# Patient Record
Sex: Female | Born: 1990 | Race: Black or African American | Hispanic: No | Marital: Single | Smoking: Never smoker
Health system: Southern US, Community
[De-identification: ages and names within clinical notes are randomized; demographics above are authoritative.]

## PROBLEM LIST (undated history)

## (undated) DIAGNOSIS — Z789 Other specified health status: Secondary | ICD-10-CM

## (undated) HISTORY — PX: WRIST SURGERY: SHX841

---
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
## 2011-01-09 ENCOUNTER — Inpatient Hospital Stay (HOSPITAL_COMMUNITY)
Admission: AD | Admit: 2011-01-09 | Discharge: 2011-01-10 | Disposition: A | Discharge status: Home / Self Care | Payer: Medicaid Other | Source: Ambulatory Visit | Attending: Obstetrics | Admitting: Obstetrics

## 2011-01-09 DIAGNOSIS — E860 Dehydration: Secondary | ICD-10-CM

## 2011-01-09 DIAGNOSIS — Z331 Pregnant state, incidental: Secondary | ICD-10-CM

## 2011-01-09 DIAGNOSIS — D649 Anemia, unspecified: Secondary | ICD-10-CM | POA: Insufficient documentation

## 2011-01-09 DIAGNOSIS — R42 Dizziness and giddiness: Secondary | ICD-10-CM

## 2011-01-09 DIAGNOSIS — O99019 Anemia complicating pregnancy, unspecified trimester: Secondary | ICD-10-CM | POA: Insufficient documentation

## 2011-01-09 HISTORY — DX: Other specified health status: Z78.9

## 2011-01-10 ENCOUNTER — Encounter (HOSPITAL_COMMUNITY): Payer: Self-pay | Admitting: *Deleted

## 2011-01-10 LAB — URINALYSIS, ROUTINE W REFLEX MICROSCOPIC
Bilirubin Urine: NEGATIVE
Glucose, UA: NEGATIVE mg/dL
Ketones, ur: 15 mg/dL — AB
Leukocytes, UA: NEGATIVE
Nitrite: NEGATIVE
Protein, ur: NEGATIVE mg/dL
Specific Gravity, Urine: >1.03 — ABNORMAL HIGH (ref 1.005–1.030)
Urobilinogen, UA: 0.2 mg/dL (ref 0.0–1.0)
pH: 6 (ref 5.0–8.0)

## 2011-01-10 LAB — COMPREHENSIVE METABOLIC PANEL
ALT: 12 U/L (ref 0–35)
AST: 14 U/L (ref 0–37)
Albumin: 3.4 g/dL — ABNORMAL LOW (ref 3.5–5.2)
Alkaline Phosphatase: 66 U/L (ref 39–117)
BUN: 9 mg/dL (ref 6–23)
CO2: 23 mEq/L (ref 19–32)
Calcium: 9.7 mg/dL (ref 8.4–10.5)
Chloride: 102 mEq/L (ref 96–112)
Creatinine, Ser: 0.62 mg/dL (ref 0.50–1.10)
GFR calc Af Amer: >90 mL/min (ref >90)
GFR calc non Af Amer: >90 mL/min (ref >90)
Glucose, Bld: 83 mg/dL (ref 70–99)
Potassium: 4.3 mEq/L (ref 3.5–5.1)
Sodium: 134 mEq/L — ABNORMAL LOW (ref 135–145)
Total Bilirubin: 0.1 mg/dL — ABNORMAL LOW (ref 0.3–1.2)
Total Protein: 6.9 g/dL (ref 6.0–8.3)

## 2011-01-10 LAB — CBC
HCT: 34.1 % — ABNORMAL LOW (ref 36.0–46.0)
Hemoglobin: 11.3 g/dL — ABNORMAL LOW (ref 12.0–15.0)
MCH: 27.5 pg (ref 26.0–34.0)
MCHC: 33.1 g/dL (ref 30.0–36.0)
MCV: 83 fL (ref 78.0–100.0)
Platelets: 212 10*3/uL (ref 150–400)
RBC: 4.11 MIL/uL (ref 3.87–5.11)
RDW: 14.8 % (ref 11.5–15.5)
WBC: 7.8 10*3/uL (ref 4.0–10.5)

## 2011-01-10 LAB — URINE MICROSCOPIC-ADD ON

## 2011-01-10 NOTE — ED Provider Notes (Signed)
History     CSN: [id number] Arrival date & time: 01/09/2011 11:41 PM   None     Chief Complaint  Patient presents with  . Dizziness   HPI Kathy Hayes is a 20 y.o. female @ [redacted]w[redacted]d gestation who presents to MAU after feeling dizzy at work. 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 She had eaten just prior to going to work. She sat down for a while be still didn't feel well and decided to go home. She was doing the same job she has done for years. She denies vaginal bleeding or abdominal pain.  She is feeling back to normal now.  Past Medical History  Diagnosis Date  . No pertinent past medical history     Past Surgical History  Procedure Date  . Wrist surgery     No family history on file.  History  Substance Use Topics  . Smoking status: Never Smoker   . Smokeless tobacco: Not on file  . Alcohol Use: No    OB History    Grav Para Term Preterm Abortions TAB SAB Ect Mult Living   1               Review of Systems  Constitutional: Negative for fever, chills, diaphoresis and fatigue.  HENT: Negative for ear pain, congestion, sore throat, facial swelling, neck pain, neck stiffness, dental problem and sinus pressure.   Eyes: Negative for photophobia, pain and discharge.  Respiratory: Negative for cough, chest tightness and wheezing.   Gastrointestinal: Negative for nausea, vomiting, abdominal pain, diarrhea, constipation and abdominal distention.  Genitourinary: Negative for dysuria, frequency, flank pain, vaginal bleeding, vaginal discharge, difficulty urinating and pelvic pain.       Pregnant.  Musculoskeletal: Negative for myalgias, back pain and gait problem.  Skin: Negative for color change and rash.  Neurological: Positive for dizziness and light-headedness. Negative for speech difficulty, weakness, numbness and headaches.  Psychiatric/Behavioral:  Negative for confusion and agitation.    Allergies  Review of patient's allergies indicates no known allergies.  Home Medications  No current outpatient prescriptions on file.  BP 110/61  Pulse 74  Temp 98.2 F (36.8 C)  Resp 20  Ht 5\' 5"  (1.651 m)  Wt 250 lb (113.399 kg)  BMI 41.60 kg/m2  SpO2 99%  Physical Exam  Nursing note and vitals reviewed. Constitutional: She is oriented to person, place, and time. No distress.       Obese   HENT:  Head: Normocephalic.  Eyes: EOM are normal.  Neck: Neck supple.  Cardiovascular: Normal rate.   Pulmonary/Chest: Effort normal.  Abdominal: Soft. There is no tenderness.       Positive FHT at 170 bpm. Low midline abdomen.   Musculoskeletal: Normal range of motion.  Neurological: She is alert and oriented to person, place, and time. She has normal strength. No cranial nerve deficit. She displays a negative Romberg sign. Coordination and gait normal.  Skin: Skin is warm and dry.  Psychiatric: She has a normal mood and affect. Her behavior is normal. Judgment and thought content normal.   Informal bedside ultrasound, active IUP with cardiac activity identified.   Results for orders placed during the hospital encounter of 01/09/11 (from the past 24 hour(s))  URINALYSIS, ROUTINE W REFLEX MICROSCOPIC     Status: Abnormal   Collection Time  01/10/11 12:10 AM      Component Value Range   Color, Urine YELLOW  YELLOW    Appearance CLEAR  CLEAR    Specific Gravity, Urine >1.030 (*) 1.005 - 1.030    pH 6.0  5.0 - 8.0    Glucose, UA NEGATIVE  NEGATIVE (mg/dL)   Hgb urine dipstick SMALL (*) NEGATIVE    Bilirubin Urine NEGATIVE  NEGATIVE    Ketones, ur 15 (*) NEGATIVE (mg/dL)   Protein, ur NEGATIVE  NEGATIVE (mg/dL)   Urobilinogen, UA 0.2  0.0 - 1.0 (mg/dL)   Nitrite NEGATIVE  NEGATIVE    Leukocytes, UA NEGATIVE  NEGATIVE   URINE MICROSCOPIC-ADD ON     Status: Abnormal   Collection Time   01/10/11 12:10 AM      Component Value Range    Squamous Epithelial / LPF FEW (*) RARE    WBC, UA 0-2  <3 (WBC/hpf)   RBC / HPF 3-6  <3 (RBC/hpf)   Bacteria, UA FEW (*) RARE   CBC     Status: Abnormal   Collection Time   01/10/11 12:55 AM      Component Value Range   WBC 7.8  4.0 - 10.5 (K/uL)   RBC 4.11  3.87 - 5.11 (MIL/uL)   Hemoglobin 11.3 (*) 12.0 - 15.0 (g/dL)   HCT 16.1 (*) 09.6 - 46.0 (%)   MCV 83.0  78.0 - 100.0 (fL)   MCH 27.5  26.0 - 34.0 (pg)   MCHC 33.1  30.0 - 36.0 (g/dL)   RDW 04.5  40.9 - 81.1 (%)   Platelets 212  150 - 400 (K/uL)  COMPREHENSIVE METABOLIC PANEL     Status: Abnormal   Collection Time   01/10/11 12:55 AM      Component Value Range   Sodium 134 (*) 135 - 145 (mEq/L)   Potassium 4.3  3.5 - 5.1 (mEq/L)   Chloride 102  96 - 112 (mEq/L)   CO2 23  19 - 32 (mEq/L)   Glucose, Bld 83  70 - 99 (mg/dL)   BUN 9  6 - 23 (mg/dL)   Creatinine, Ser 9.14  0.50 - 1.10 (mg/dL)   Calcium 9.7  8.4 - 78.2 (mg/dL)   Total Protein 6.9  6.0 - 8.3 (g/dL)   Albumin 3.4 (*) 3.5 - 5.2 (g/dL)   AST 14  0 - 37 (U/L)   ALT 12  0 - 35 (U/L)   Alkaline Phosphatase 66  39 - 117 (U/L)   Total Bilirubin 0.1 (*) 0.3 - 1.2 (mg/dL)   GFR calc non Af Amer >90  >90 (mL/min)   GFR calc Af Amer >90  >90 (mL/min)    Assessment: Dizziness   First trimester pregnancy   Mild dehydration   Mild anemia  Plan:  Increase po fluid intake   Eat small meals more frequently   Follow up with Dr. Gaynell Face   Urine culture, GC, Chlamydia cultures pending   ED Course  Procedures  MDM          Kerrie Buffalo, NP 01/10/11 1434

## 2011-01-10 NOTE — Progress Notes (Signed)
Pt signed the D/C form because the E-Chart signature pad was not working

## 2011-01-10 NOTE — Progress Notes (Signed)
Pt reports while at work today she started having "dizzy spells, and hot flashes" states she felt like she was going to pass out. Denies pain. Denies bleeding. Has had vomiting throughout preg. [facility] 08/14/2011

## 2011-01-10 NOTE — Progress Notes (Signed)
Pt states she feels fine-she stood t the bedside for 4 min w/o feeling dizzy or weak-states she felt that way at work at [employer] due to the light over the hot food

## 2011-02-28 NOTE — L&D Delivery Note (Signed)
Delivery Note At 10:29 AM a viable female was delivered via Vaginal, Spontaneous Delivery (Presentation: ; Occiput Anterior).  APGAR: 8, 9; weight .   Placenta status: , .  Cord: 3 vessels with the following complications: None.  Cord pH: not done  Anesthesia: None  Episiotomy: Median Lacerations: None Suture Repair: 2.0 vicryl Est. Blood Loss (mL):   Mom to postpartum.  Baby to nursery-stable.  Iesha Summerhill A 08/01/2011, 10:46 AM

## 2011-03-23 ENCOUNTER — Other Ambulatory Visit (HOSPITAL_COMMUNITY): Payer: Self-pay | Admitting: Obstetrics

## 2011-03-23 DIAGNOSIS — Z3689 Encounter for other specified antenatal screening: Secondary | ICD-10-CM

## 2011-03-28 ENCOUNTER — Ambulatory Visit (HOSPITAL_COMMUNITY)
Admission: RE | Admit: 2011-03-28 | Discharge: 2011-03-28 | Disposition: A | Discharge status: Home / Self Care | Payer: PRIVATE HEALTH INSURANCE | Source: Ambulatory Visit | Attending: Obstetrics | Admitting: Obstetrics

## 2011-03-28 DIAGNOSIS — Z1389 Encounter for screening for other disorder: Secondary | ICD-10-CM | POA: Insufficient documentation

## 2011-03-28 DIAGNOSIS — Z363 Encounter for antenatal screening for malformations: Secondary | ICD-10-CM | POA: Insufficient documentation

## 2011-03-28 DIAGNOSIS — Z3689 Encounter for other specified antenatal screening: Secondary | ICD-10-CM

## 2011-03-28 DIAGNOSIS — O358XX0 Maternal care for other (suspected) fetal abnormality and damage, not applicable or unspecified: Secondary | ICD-10-CM | POA: Insufficient documentation

## 2011-08-01 ENCOUNTER — Inpatient Hospital Stay (HOSPITAL_COMMUNITY)
Admission: AD | Admit: 2011-08-01 | Discharge: 2011-08-03 | DRG: 775 | Disposition: A | Discharge status: Home / Self Care | Payer: PRIVATE HEALTH INSURANCE | Source: Ambulatory Visit | Attending: Obstetrics | Admitting: Obstetrics

## 2011-08-01 ENCOUNTER — Encounter (HOSPITAL_COMMUNITY): Payer: Self-pay | Admitting: *Deleted

## 2011-08-01 LAB — CBC
HCT: 39.3 % (ref 36.0–46.0)
MCH: 26.1 pg (ref 26.0–34.0)
MCHC: 31.3 g/dL (ref 30.0–36.0)
MCV: 83.4 fL (ref 78.0–100.0)
RDW: 15.4 % (ref 11.5–15.5)

## 2011-08-01 LAB — OB RESULTS CONSOLE HEPATITIS B SURFACE ANTIGEN: Hepatitis B Surface Ag: NEGATIVE

## 2011-08-01 LAB — OB RESULTS CONSOLE ABO/RH: RH Type: POSITIVE

## 2011-08-01 LAB — OB RESULTS CONSOLE ANTIBODY SCREEN: Antibody Screen: NEGATIVE

## 2011-08-01 LAB — OB RESULTS CONSOLE GBS: GBS: NEGATIVE

## 2011-08-01 LAB — OB RESULTS CONSOLE RUBELLA ANTIBODY, IGM: Rubella: IMMUNE

## 2011-08-01 MED ORDER — DIPHENHYDRAMINE HCL 25 MG PO CAPS: 25.0000 mg | ORAL_CAPSULE | Freq: Four times a day (QID) | ORAL | Status: DC | PRN

## 2011-08-01 MED ORDER — LANOLIN HYDROUS EX OINT: TOPICAL_OINTMENT | CUTANEOUS | Status: DC | PRN

## 2011-08-01 MED ORDER — BUTORPHANOL TARTRATE 2 MG/ML IJ SOLN: 1.0000 mg | INTRAMUSCULAR | Status: DC | PRN

## 2011-08-01 MED ORDER — PHENYLEPHRINE 40 MCG/ML (10ML) SYRINGE FOR IV PUSH (FOR BLOOD PRESSURE SUPPORT): 80.0000 ug | PREFILLED_SYRINGE | INTRAVENOUS | Status: DC | PRN

## 2011-08-01 MED ORDER — WITCH HAZEL-GLYCERIN EX PADS: 1.0000 "application " | MEDICATED_PAD | CUTANEOUS | Status: DC | PRN

## 2011-08-01 MED ORDER — OXYTOCIN 20 UNITS IN LACTATED RINGERS INFUSION - SIMPLE
125.0000 mL/h | Freq: Once | INTRAVENOUS | Status: AC
  Administered 2011-08-01: 999 mL/h via INTRAVENOUS

## 2011-08-01 MED ORDER — ONDANSETRON HCL 4 MG/2ML IJ SOLN: 4.0000 mg | Freq: Four times a day (QID) | INTRAMUSCULAR | Status: DC | PRN

## 2011-08-01 MED ORDER — IBUPROFEN 600 MG PO TABS
600.0000 mg | ORAL_TABLET | Freq: Four times a day (QID) | ORAL | Status: DC
  Administered 2011-08-01 – 2011-08-03 (×6): 600 mg via ORAL
  Filled 2011-08-01 (×3): qty 1

## 2011-08-01 MED ORDER — EPHEDRINE 5 MG/ML INJ: 10.0000 mg | INTRAVENOUS | Status: DC | PRN

## 2011-08-01 MED ORDER — OXYCODONE-ACETAMINOPHEN 5-325 MG PO TABS: 1.0000 | ORAL_TABLET | ORAL | Status: DC | PRN

## 2011-08-01 MED ORDER — DIBUCAINE 1 % RE OINT: 1.0000 "application " | TOPICAL_OINTMENT | RECTAL | Status: DC | PRN

## 2011-08-01 MED ORDER — IBUPROFEN 600 MG PO TABS
600.0000 mg | ORAL_TABLET | Freq: Four times a day (QID) | ORAL | Status: DC | PRN
  Administered 2011-08-01: 600 mg via ORAL
  Filled 2011-08-01 (×4): qty 1

## 2011-08-01 MED ORDER — LACTATED RINGERS IV SOLN: 500.0000 mL | INTRAVENOUS | Status: DC | PRN

## 2011-08-01 MED ORDER — ONDANSETRON HCL 4 MG PO TABS: 4.0000 mg | ORAL_TABLET | ORAL | Status: DC | PRN

## 2011-08-01 MED ORDER — LACTATED RINGERS IV SOLN: 500.0000 mL | Freq: Once | INTRAVENOUS | Status: DC

## 2011-08-01 MED ORDER — DIPHENHYDRAMINE HCL 50 MG/ML IJ SOLN: 12.5000 mg | INTRAMUSCULAR | Status: DC | PRN

## 2011-08-01 MED ORDER — ACETAMINOPHEN 325 MG PO TABS: 650.0000 mg | ORAL_TABLET | ORAL | Status: DC | PRN

## 2011-08-01 MED ORDER — FENTANYL 2.5 MCG/ML BUPIVACAINE 1/10 % EPIDURAL INFUSION (WH - ANES): 14.0000 mL/h | INTRAMUSCULAR | Status: DC

## 2011-08-01 MED ORDER — LIDOCAINE HCL (PF) 1 % IJ SOLN
30.0000 mL | INTRAMUSCULAR | Status: DC | PRN
  Administered 2011-08-01: 30 mL via SUBCUTANEOUS
  Filled 2011-08-01: qty 30

## 2011-08-01 MED ORDER — CITRIC ACID-SODIUM CITRATE 334-500 MG/5ML PO SOLN: 30.0000 mL | ORAL | Status: DC | PRN

## 2011-08-01 MED ORDER — SENNOSIDES-DOCUSATE SODIUM 8.6-50 MG PO TABS
2.0000 | ORAL_TABLET | Freq: Every day | ORAL | Status: DC
  Administered 2011-08-01 – 2011-08-02 (×2): 2 via ORAL

## 2011-08-01 MED ORDER — LACTATED RINGERS IV SOLN
INTRAVENOUS | Status: DC
  Administered 2011-08-01: 10:00:00 via INTRAVENOUS

## 2011-08-01 MED ORDER — FLEET ENEMA 7-19 GM/118ML RE ENEM: 1.0000 | ENEMA | RECTAL | Status: DC | PRN

## 2011-08-01 MED ORDER — ONDANSETRON HCL 4 MG/2ML IJ SOLN: 4.0000 mg | INTRAMUSCULAR | Status: DC | PRN

## 2011-08-01 MED ORDER — PRENATAL MULTIVITAMIN CH
1.0000 | ORAL_TABLET | Freq: Every day | ORAL | Status: DC
  Administered 2011-08-02 – 2011-08-03 (×2): 1 via ORAL
  Filled 2011-08-01: qty 1

## 2011-08-01 MED ORDER — FERROUS SULFATE 325 (65 FE) MG PO TABS
325.0000 mg | ORAL_TABLET | Freq: Two times a day (BID) | ORAL | Status: DC
  Administered 2011-08-02 – 2011-08-03 (×3): 325 mg via ORAL
  Filled 2011-08-01 (×3): qty 1

## 2011-08-01 MED ORDER — OXYTOCIN BOLUS FROM INFUSION
500.0000 mL | Freq: Once | INTRAVENOUS | Status: DC
  Filled 2011-08-01: qty 1000
  Filled 2011-08-01: qty 500

## 2011-08-01 MED ORDER — TETANUS-DIPHTH-ACELL PERTUSSIS 5-2.5-18.5 LF-MCG/0.5 IM SUSP
0.5000 mL | Freq: Once | INTRAMUSCULAR | Status: AC
  Administered 2011-08-02: 0.5 mL via INTRAMUSCULAR
  Filled 2011-08-01: qty 0.5

## 2011-08-01 MED ORDER — PROMETHAZINE HCL 25 MG/ML IJ SOLN: 12.5000 mg | INTRAMUSCULAR | Status: DC | PRN

## 2011-08-01 MED ORDER — ZOLPIDEM TARTRATE 5 MG PO TABS: 5.0000 mg | ORAL_TABLET | Freq: Every evening | ORAL | Status: DC | PRN

## 2011-08-01 MED ORDER — BENZOCAINE-MENTHOL 20-0.5 % EX AERO: 1.0000 "application " | INHALATION_SPRAY | CUTANEOUS | Status: DC | PRN

## 2011-08-01 MED ORDER — SIMETHICONE 80 MG PO CHEW: 80.0000 mg | CHEWABLE_TABLET | ORAL | Status: DC | PRN

## 2011-08-01 NOTE — MAU Note (Signed)
Ctx's started at 0430. Every 2-3. Denies bleeding, ? Leaking - keep peeing on myself, I don't feel like I'm going.  Pains getting stronger and closer

## 2011-08-01 NOTE — Progress Notes (Signed)
UR Chart review completed.  

## 2011-08-01 NOTE — H&P (Signed)
This is Dr. Francoise Ceo dictating the history and physical on blank blank she is a 21 year old gravida 1 at 38+ weeks and 08/14/2011 negative GBS and she was admitted in labor 7 cm and by the time she got to labor and delivery she was 100 dilated she had a normal vaginal liver of a female Apgar 9 and 9 from the ROA position over a midline episiotomy the placenta was spontaneous and episiotomy was repaired with 2-0 Vicryl suture Past medical history negative Past surgical history negative Social history noncontributory System review negative Physical exam well-developed female post delivery HEENT negative Rest negative Lungs clear to P&Asocial history negative abdomen uterus 20 weeks postpartum heart regular rhythm no murmurs no gallops Pelvic as described above Extremities negative

## 2011-08-02 LAB — CBC
HCT: 30.7 % — ABNORMAL LOW (ref 36.0–46.0)
MCHC: 31.9 g/dL (ref 30.0–36.0)
RDW: 15.3 % (ref 11.5–15.5)

## 2011-08-02 NOTE — Progress Notes (Signed)
Patient ID: Kathy Hayes, female   DOB: 1990/09/25, 21 y.o.   MRN: [id number] Postpartum day one Vital signs normal Fundus firm Lochia moderate Legs negative No complaints

## 2011-08-03 NOTE — Discharge Instructions (Signed)
Discharge instructions   You can wash your hair  Shower  Eat what you want  Drink what you want  See me in 6 weeks  Your ankles are going to swell more in the next 2 weeks than when pregnant  No sex for 6 weeks   Kathy Hayes A, MD 08/03/2011    

## 2011-08-03 NOTE — Discharge Summary (Signed)
Obstetric Discharge Summary Reason for Admission: onset of labor Prenatal Procedures: none Intrapartum Procedures: spontaneous vaginal delivery Postpartum Procedures: none Complications-Operative and Postpartum: none Hemoglobin  Date Value Range Status  08/02/2011 9.8* 12.0-15.0 (g/dL) Final     DELTA CHECK NOTED     REPEATED TO VERIFY     HCT  Date Value Range Status  08/02/2011 30.7* 36.0-46.0 (%) Final    Physical Exam:  General: alert Lochia: appropriate Uterine Fundus: firm Incision: healing well DVT Evaluation: No evidence of DVT seen on physical exam.  Discharge Diagnoses: Term Pregnancy-delivered  Discharge Information: Date: 08/03/2011 Activity: pelvic rest Diet: routine Medications: Percocet Condition: stable Instructions: refer to practice specific booklet Discharge to: home Follow-up Information    Follow up with Macala Baldonado A, MD. Call in 6 weeks.   Contact information:   [address] [place] [address] [phone]          Newborn Data: Live born female  Birth Weight: 6 lb 15.6 oz (3164 g) APGAR: 8, 9  Home with mother.  Cinque Begley A 08/03/2011, 7:03 AM

## 2013-12-29 ENCOUNTER — Encounter (HOSPITAL_COMMUNITY): Payer: Self-pay | Admitting: *Deleted

## 2014-06-20 ENCOUNTER — Emergency Department (HOSPITAL_COMMUNITY)
Admission: EM | Admit: 2014-06-20 | Discharge: 2014-06-21 | Disposition: A | Discharge status: Home / Self Care | Payer: BLUE CROSS/BLUE SHIELD | Attending: Emergency Medicine | Admitting: Emergency Medicine

## 2014-06-20 ENCOUNTER — Emergency Department (HOSPITAL_COMMUNITY): Payer: BLUE CROSS/BLUE SHIELD

## 2014-06-20 ENCOUNTER — Encounter (HOSPITAL_COMMUNITY): Payer: Self-pay | Admitting: Emergency Medicine

## 2014-06-20 DIAGNOSIS — R079 Chest pain, unspecified: Secondary | ICD-10-CM | POA: Diagnosis not present

## 2014-06-20 DIAGNOSIS — R509 Fever, unspecified: Secondary | ICD-10-CM

## 2014-06-20 DIAGNOSIS — Z3202 Encounter for pregnancy test, result negative: Secondary | ICD-10-CM | POA: Insufficient documentation

## 2014-06-20 LAB — BASIC METABOLIC PANEL
Anion gap: 6 (ref 5–15)
BUN: 9 mg/dL (ref 6–23)
CO2: 21 mmol/L (ref 19–32)
CREATININE: 0.73 mg/dL (ref 0.50–1.10)
Calcium: 8.5 mg/dL (ref 8.4–10.5)
Chloride: 107 mmol/L (ref 96–112)
GLUCOSE: 105 mg/dL — AB (ref 70–99)
Potassium: 3.1 mmol/L — ABNORMAL LOW (ref 3.5–5.1)
Sodium: 134 mmol/L — ABNORMAL LOW (ref 135–145)

## 2014-06-20 LAB — CBC
HCT: 37.2 % (ref 36.0–46.0)
Hemoglobin: 12 g/dL (ref 12.0–15.0)
MCH: 26.4 pg (ref 26.0–34.0)
MCHC: 32.3 g/dL (ref 30.0–36.0)
MCV: 81.8 fL (ref 78.0–100.0)
Platelets: 208 10*3/uL (ref 150–400)
RBC: 4.55 MIL/uL (ref 3.87–5.11)
RDW: 14.6 % (ref 11.5–15.5)
WBC: 4.7 10*3/uL (ref 4.0–10.5)

## 2014-06-20 LAB — I-STAT TROPONIN, ED: Troponin i, poc: 0 ng/mL (ref 0.00–0.08)

## 2014-06-20 MED ORDER — ACETAMINOPHEN 325 MG PO TABS
650.0000 mg | ORAL_TABLET | Freq: Four times a day (QID) | ORAL | Status: DC | PRN
  Administered 2014-06-20: 650 mg via ORAL
  Filled 2014-06-20 (×2): qty 2

## 2014-06-20 NOTE — ED Notes (Signed)
Pt reports onset of centralized chest pain this morning. Pt states she was having abdominal pain yesterday that has turned into chest pain today. Pt reports slight cough as well.

## 2014-06-21 ENCOUNTER — Emergency Department (HOSPITAL_COMMUNITY): Payer: BLUE CROSS/BLUE SHIELD

## 2014-06-21 ENCOUNTER — Encounter (HOSPITAL_COMMUNITY): Payer: Self-pay | Admitting: Emergency Medicine

## 2014-06-21 LAB — HEPATIC FUNCTION PANEL
ALT: 12 U/L (ref 0–35)
AST: 21 U/L (ref 0–37)
Albumin: 3.5 g/dL (ref 3.5–5.2)
Alkaline Phosphatase: 70 U/L (ref 39–117)
TOTAL PROTEIN: 6.9 g/dL (ref 6.0–8.3)
Total Bilirubin: 0.4 mg/dL (ref 0.3–1.2)

## 2014-06-21 LAB — URINE MICROSCOPIC-ADD ON

## 2014-06-21 LAB — POC URINE PREG, ED: Preg Test, Ur: NEGATIVE

## 2014-06-21 LAB — URINALYSIS, ROUTINE W REFLEX MICROSCOPIC
GLUCOSE, UA: NEGATIVE mg/dL
Ketones, ur: 40 mg/dL — AB
LEUKOCYTES UA: NEGATIVE
Nitrite: NEGATIVE
PH: 6 (ref 5.0–8.0)
PROTEIN: 30 mg/dL — AB
Urobilinogen, UA: 1 mg/dL (ref 0.0–1.0)

## 2014-06-21 LAB — LIPASE, BLOOD: Lipase: 17 U/L (ref 11–59)

## 2014-06-21 LAB — RAPID STREP SCREEN (MED CTR MEBANE ONLY): STREPTOCOCCUS, GROUP A SCREEN (DIRECT): NEGATIVE

## 2014-06-21 LAB — MONONUCLEOSIS SCREEN: MONO SCREEN: NEGATIVE

## 2014-06-21 LAB — D-DIMER, QUANTITATIVE (NOT AT ARMC): D DIMER QUANT: 0.99 ug{FEU}/mL — AB (ref 0.00–0.48)

## 2014-06-21 MED ORDER — POTASSIUM CHLORIDE CRYS ER 20 MEQ PO TBCR
40.0000 meq | EXTENDED_RELEASE_TABLET | Freq: Once | ORAL | Status: AC
  Administered 2014-06-21: 40 meq via ORAL
  Filled 2014-06-21: qty 2

## 2014-06-21 MED ORDER — SUCRALFATE 1 GM/10ML PO SUSP
1.0000 g | Freq: Three times a day (TID) | ORAL | Status: DC
Start: 2014-06-21 — End: 2015-06-05

## 2014-06-21 MED ORDER — SODIUM CHLORIDE 0.9 % IV BOLUS (SEPSIS): 1000.0000 mL | Freq: Once | INTRAVENOUS | Status: DC

## 2014-06-21 MED ORDER — SODIUM CHLORIDE 0.9 % IV BOLUS (SEPSIS)
1000.0000 mL | Freq: Once | INTRAVENOUS | Status: AC
  Administered 2014-06-21: 1000 mL via INTRAVENOUS

## 2014-06-21 MED ORDER — IBUPROFEN 600 MG PO TABS: 600.0000 mg | ORAL_TABLET | Freq: Four times a day (QID) | ORAL | Status: DC | PRN

## 2014-06-21 MED ORDER — IOHEXOL 350 MG/ML SOLN
100.0000 mL | Freq: Once | INTRAVENOUS | Status: AC | PRN
  Administered 2014-06-21: 100 mL via INTRAVENOUS

## 2014-06-21 MED ORDER — PANTOPRAZOLE SODIUM 20 MG PO TBEC: 20.0000 mg | DELAYED_RELEASE_TABLET | Freq: Every day | ORAL | Status: DC

## 2014-06-21 NOTE — ED Provider Notes (Signed)
1610 - Patient care assumed from [facility], PA-C at shift change. Patient presenting for c/o chest pain; noted to be febrile on arrival. This improved with antipyretics. Patient with no leukocytosis today. Liver and kidney function preserved. Urinalysis does not suggest infection. Urine pregnancy is negative. Patient also noted to have a negative Monospot and strep screen. Chest x-ray negative for pneumonia. Patient did have a slightly elevated d-dimer for which a CT angio was ordered. This shows no evidence of pulmonary embolus or pneumonia.  Patient states that she is feeling much better. She is nontoxic and nonseptic appearing as well as in no distress. Vital signs stable over ED course. She has been hydrated in the ED. Suspect viral, flu-like illness as cause of fever. Doubt ACS as cause of chest pain. EKG nonischemic and troponin negative. Will d/c with supportive treatment and instruction for PCP follow up. Return precautions discussed and provided. Patient agreeable to plan with no unaddressed concerns. Patient discharged in good condition.   Results for orders placed or performed during the hospital encounter of 06/20/14  Rapid strep screen  Result Value Ref Range   Streptococcus, Group A Screen (Direct) NEGATIVE NEGATIVE  CBC  Result Value Ref Range   WBC 4.7 4.0 - 10.5 K/uL   RBC 4.55 3.87 - 5.11 MIL/uL   Hemoglobin 12.0 12.0 - 15.0 g/dL   HCT 96.0 45.4 - 09.8 %   MCV 81.8 78.0 - 100.0 fL   MCH 26.4 26.0 - 34.0 pg   MCHC 32.3 30.0 - 36.0 g/dL   RDW 11.9 14.7 - 82.9 %   Platelets 208 150 - 400 K/uL  Basic metabolic panel  Result Value Ref Range   Sodium 134 (L) 135 - 145 mmol/L   Potassium 3.1 (L) 3.5 - 5.1 mmol/L   Chloride 107 96 - 112 mmol/L   CO2 21 19 - 32 mmol/L   Glucose, Bld 105 (H) 70 - 99 mg/dL   BUN 9 6 - 23 mg/dL   Creatinine, Ser 5.62 0.50 - 1.10 mg/dL   Calcium 8.5 8.4 - 13.0 mg/dL   GFR calc non Af Amer >90 >90 mL/min   GFR calc Af Amer >90 >90 mL/min   Anion gap 6 5 - 15  Urinalysis with microscopic  Result Value Ref Range   Color, Urine YELLOW YELLOW   APPearance TURBID (A) CLEAR   Specific Gravity, Urine >1.030 (H) 1.005 - 1.030   pH 6.0 5.0 - 8.0   Glucose, UA NEGATIVE NEGATIVE mg/dL   Hgb urine dipstick SMALL (A) NEGATIVE   Bilirubin Urine SMALL (A) NEGATIVE   Ketones, ur 40 (A) NEGATIVE mg/dL   Protein, ur 30 (A) NEGATIVE mg/dL   Urobilinogen, UA 1.0 0.0 - 1.0 mg/dL   Nitrite NEGATIVE NEGATIVE   Leukocytes, UA NEGATIVE NEGATIVE  Mononucleosis screen  Result Value Ref Range   Mono Screen NEGATIVE NEGATIVE  D-dimer, quantitative  Result Value Ref Range   D-Dimer, Quant 0.99 (H) 0.00 - 0.48 ug/mL-FEU  Hepatic function panel  Result Value Ref Range   Total Protein 6.9 6.0 - 8.3 g/dL   Albumin 3.5 3.5 - 5.2 g/dL   AST 21 0 - 37 U/L   ALT 12 0 - 35 U/L   Alkaline Phosphatase 70 39 - 117 U/L   Total Bilirubin 0.4 0.3 - 1.2 mg/dL   Bilirubin, Direct <8.6 0.0 - 0.5 mg/dL   Indirect Bilirubin NOT CALCULATED 0.3 - 0.9 mg/dL  Lipase, blood  Result Value Ref Range  Lipase 17 11 - 59 U/L  Urine microscopic-add on  Result Value Ref Range   Squamous Epithelial / LPF MANY (A) RARE   WBC, UA 0-2 <3 WBC/hpf   RBC / HPF 0-2 <3 RBC/hpf   Bacteria, UA MANY (A) RARE   Crystals CA OXALATE CRYSTALS (A) NEGATIVE   Urine-Other MUCOUS PRESENT   I-stat troponin, ED (not at [facility])  Result Value Ref Range   Troponin i, poc 0.00 0.00 - 0.08 ng/mL   Comment 3          POC Urine Pregnancy, ED (pre-menopausal females) - do not [facility] at [facility]  Result Value Ref Range   Preg Test, Ur NEGATIVE NEGATIVE   Dg Chest 2 View  06/20/2014   CLINICAL DATA:  Chest pain which began earlier today.  Slight cough.  EXAM: CHEST  2 VIEW  COMPARISON:  None.  FINDINGS: The heart size and mediastinal contours are within normal limits. Both lungs are clear. The visualized skeletal structures are unremarkable.  IMPRESSION: No active cardiopulmonary disease.    Electronically Signed   By: Davonna BellingJohn  Curnes M.D.   On: 06/20/2014 23:25   Ct Angio Chest Pe W/cm &/or Wo Cm  06/21/2014   CLINICAL DATA:  Acute onset of substernal chest aching and epigastric abdominal pain. Diarrhea and sore throat. Elevated D-dimer. Initial encounter.  EXAM: CT ANGIOGRAPHY CHEST WITH CONTRAST  TECHNIQUE: Multidetector CT imaging of the chest was performed using the standard protocol during bolus administration of intravenous contrast. Multiplanar CT image reconstructions and MIPs were obtained to evaluate the vascular anatomy.  CONTRAST:  100mL OMNIPAQUE IOHEXOL 350 MG/ML SOLN  COMPARISON:  Chest radiograph performed 06/20/2014  FINDINGS: There is no evidence of pulmonary embolus.  The lungs are clear bilaterally. There is no evidence of significant focal consolidation, pleural effusion or pneumothorax. No masses are identified; no abnormal focal contrast enhancement is seen.  The mediastinum is unremarkable in appearance. No mediastinal lymphadenopathy is seen. No pericardial effusion is identified. The great vessels are grossly unremarkable in appearance. No axillary lymphadenopathy is seen. The visualized portions of the thyroid gland are unremarkable in appearance.  The visualized portions of the liver and spleen are unremarkable.  No acute osseous abnormalities are seen.  Review of the MIP images confirms the above findings.  IMPRESSION: 1. No evidence of pulmonary embolus. 2. Lungs clear bilaterally.   Electronically Signed   By: Roanna RaiderJeffery  Chang M.D.   On: 06/21/2014 03:54      Antony MaduraKelly Channin Agustin, PA-C 06/21/14 [id number]  April Palumbo, MD 06/21/14 [id number]

## 2014-06-21 NOTE — ED Provider Notes (Signed)
CSN: [id number]     Arrival date & time 06/20/14  2152 History   First MD Initiated Contact with Patient 06/20/14 2345     Chief Complaint  Patient presents with  . Chest Pain     (Consider location/radiation/quality/duration/timing/severity/associated sxs/prior Treatment) HPI   Kathy Hayes is a 24 y.o. female complaining of laboratory upper abdominal pain onset yesterday associated with watery 2 episodes of watery diarrhea. She had nausea with no vomiting. States that when she woke up this morning she had substernal chest pain which she describes as aching and rated at 6 out of 10, no pain medication taken prior to arrival. She endorses dry cough, denies pleuritic exacerbation of pain. She has Implanon birth control, denies recent trips or mobilizations, calf pain, leg swelling. She states that the pain is exacerbated by laying supine. She did not realize she had a fever. States that she had a sore throat yesterday which is resolved. She denies rhinorrhea, sick contacts, dysuria, hematuria, rash, headache, cervicalgia, recent travel, states she's been sleeping less than normal but that's just because she's been very busy over the last few weeks with church and her children.  Past Medical History  Diagnosis Date  . No pertinent past medical history    Past Surgical History  Procedure Laterality Date  . Wrist surgery     Family History  Problem Relation Age of Onset  . Anesthesia problems Neg Hx    History  Substance Use Topics  . Smoking status: Never Smoker   . Smokeless tobacco: Never Used  . Alcohol Use: No   OB History    Gravida Para Term Preterm AB TAB SAB Ectopic Multiple Living   0 0 0 0 0 0 1     Review of Systems  10 systems reviewed and found to be negative, except as noted in the HPI.  Allergies  Review of patient's allergies indicates no known allergies.  Home Medications   Prior to Admission medications   Medication Sig Start Date End Date Taking?  Authorizing Provider  ibuprofen (ADVIL,MOTRIN) 200 MG tablet Take 400 mg by mouth every 6 (six) hours as needed for moderate pain.   Yes Historical Provider, MD   BP 147/109 mmHg  Pulse 103  Temp(Src) 102.2 F (39 C) (Oral)  Resp 20  Ht  (1.626 m)  Wt 280 lb (127.007 kg)  BMI 48.04 kg/m2  SpO2 99%  LMP 06/12/2014 Physical Exam  Constitutional: She is oriented to person, place, and time. She appears well-developed and well-nourished. No distress.  Feels warm  HENT:  Head: Normocephalic and atraumatic.  Mouth/Throat: Oropharynx is clear and moist.  No drooling or stridor. Posterior pharynx mildly erythematous no significant tonsillar hypertrophy. No exudate. Soft palate rises symmetrically. No TTP or induration under tongue.   No tenderness to palpation of frontal or bilateral maxillary sinuses.  No mucosal edema in the nares.  Bilateral tympanic membranes with normal architecture and good light reflex.    Eyes: Conjunctivae and EOM are normal. Pupils are equal, round, and reactive to light.  Neck: Normal range of motion. Neck supple.  FROM to C-spine. Pt can touch chin to chest without discomfort. No TTP of midline cervical spine.   Cardiovascular: Normal rate, regular rhythm and intact distal pulses.   Pulmonary/Chest: Effort normal and breath sounds normal. No respiratory distress. She has no wheezes. She has no rales. She exhibits no tenderness.  Abdominal: Soft. Bowel sounds are normal. She exhibits no  distension and no mass. There is no tenderness. There is no rebound and no guarding.  Musculoskeletal: Normal range of motion.  No calf asymmetry, superficial collaterals, palpable cords, edema, Homans sign negative bilaterally.    Lymphadenopathy:    She has no cervical adenopathy.  Neurological: She is alert and oriented to person, place, and time.  Skin: Skin is warm. She is not diaphoretic.  Psychiatric: She has a normal mood and affect.  Nursing note and vitals  reviewed.   ED Course  Procedures (including critical care time) Labs Review Labs Reviewed  BASIC METABOLIC PANEL - Abnormal; Notable for the following:    Sodium 134 (*)    Potassium 3.1 (*)    Glucose, Bld 105 (*)    All other components within normal limits  RAPID STREP SCREEN  CBC  URINALYSIS, ROUTINE W REFLEX MICROSCOPIC  MONONUCLEOSIS SCREEN  D-DIMER, QUANTITATIVE  HEPATIC FUNCTION PANEL  LIPASE, BLOOD  I-STAT TROPOININ, ED  POC URINE PREG, ED  I-STAT BETA HCG BLOOD, ED ([facility], [facility], [facility] ONLY)    Imaging Review Dg Chest 2 View  06/20/2014   CLINICAL DATA:  Chest pain which began earlier today.  Slight cough.  EXAM: CHEST  2 VIEW  COMPARISON:  None.  FINDINGS: The heart size and mediastinal contours are within normal limits. Both lungs are clear. The visualized skeletal structures are unremarkable.  IMPRESSION: No active cardiopulmonary disease.   Electronically Signed   By: Davonna BellingJohn  Curnes M.D.   On: 06/20/2014 23:25     EKG Interpretation   Date/Time:  Saturday June 20 2014 22:02:15 EDT Ventricular Rate:  91 PR Interval:  140 QRS Duration: 82 QT Interval:  331 QTC Calculation: 407 R Axis:   37 Text Interpretation:  Sinus rhythm Confirmed by [facility]  MD, APRIL  ([id number]) on 06/21/2014 12:20:26 AM      MDM   Final diagnoses:  None    Filed Vitals:   06/20/14 2204  BP: 147/109  Pulse: 103  Temp: 102.2 F (39 C)  TempSrc: Oral  Resp: 20  Height: 5\' 4"  (1.626 m)  Weight: 280 lb (127.007 kg)  SpO2: 99%    Medications  acetaminophen (TYLENOL) tablet 650 mg (650 mg Oral Given 06/20/14 2218)  sodium chloride 0.9 % bolus 1,000 mL (not administered)  potassium chloride SA (K-DUR,KLOR-CON) CR tablet 40 mEq (not administered)    Kathy Hayes is a pleasant 24 y.o. female presenting with aching substernal chest pain onset this morning. Patient had epigastric pain yesterday which is resolved. She had several episodes of watery diarrhea. Potassium is low this is  likely secondary to her diarrhea. Patient is febrile to 102.2 here unclear what the source of her fever is. Chest x-ray is without infiltrate. Discussed case with attending MD who agrees with plan.   Case signed out to PA Humes at shift change: Plan is to follow-up LFTs, lipase, mono, strep, dimer, UA.     Wynetta Emeryicole Gunnar Hereford, PA-C 06/21/14 0052  Wynetta EmeryNicole Turner Baillie, PA-C 06/21/14 [id number]  Cy BlamerApril Palumbo, MD 06/21/14 [id number]

## 2014-06-21 NOTE — Discharge Instructions (Signed)
Take Tylenol or ibuprofen for fever. Be sure to get plenty of rest and drink plenty of fluids. Recommend taking Carafate and Protonix as your chest pain may have been caused by reflux. Return to your primary doctor for follow-up and recheck of symptoms in 3 days.  Influenza Influenza ("the flu") is a viral infection of the respiratory tract. It occurs more often in winter months because people spend more time in close contact with one another. Influenza can make you feel very sick. Influenza easily spreads from person to person (contagious). CAUSES  Influenza is caused by a virus that infects the respiratory tract. You can catch the virus by breathing in droplets from an infected person's cough or sneeze. You can also catch the virus by touching something that was recently contaminated with the virus and then touching your mouth, nose, or eyes. RISKS AND COMPLICATIONS You may be at risk for a more severe case of influenza if you smoke cigarettes, have diabetes, have chronic heart disease (such as heart failure) or lung disease (such as asthma), or if you have a weakened immune system. Elderly people and pregnant women are also at risk for more serious infections. The most common problem of influenza is a lung infection (pneumonia). Sometimes, this problem can require emergency medical care and may be life threatening. SIGNS AND SYMPTOMS  Symptoms typically last 4 to 10 days and may include:  Fever.  Chills.  Headache, body aches, and muscle aches.  Sore throat.  Chest discomfort and cough.  Poor appetite.  Weakness or feeling tired.  Dizziness.  Nausea or vomiting. DIAGNOSIS  Diagnosis of influenza is often made based on your history and a physical exam. A nose or throat swab test can be done to confirm the diagnosis. TREATMENT  In mild cases, influenza goes away on its own. Treatment is directed at relieving symptoms. For more severe cases, your health care provider may prescribe  antiviral medicines to shorten the sickness. Antibiotic medicines are not effective because the infection is caused by a virus, not by bacteria. HOME CARE INSTRUCTIONS  Take medicines only as directed by your health care provider.  Use a cool mist humidifier to make breathing easier.  Get plenty of rest until your temperature returns to normal. This usually takes 3 to 4 days.  Drink enough fluid to keep your urine clear or pale yellow.  Cover yourmouth and nosewhen coughing or sneezing,and wash your handswellto prevent thevirusfrom spreading.  Stay homefromwork orschool untilthe fever is gonefor at least 531full day. PREVENTION  An annual influenza vaccination (flu shot) is the best way to avoid getting influenza. An annual flu shot is now routinely recommended for all adults in the [place] SEEK MEDICAL CARE IF:  You experiencechest pain, yourcough worsens,or you producemore mucus.  Youhave nausea,vomiting, ordiarrhea.  Your fever returns or gets worse. SEEK IMMEDIATE MEDICAL CARE IF:  You havetrouble breathing, you become short of breath,or your skin ornails becomebluish.  You have severe painor stiffnessin the neck.  You develop a sudden headache, or pain in the face or ear.  You have nausea or vomiting that you cannot control. MAKE SURE YOU:   Understand these instructions.  Will watch your condition.  Will get help right away if you are not doing well or get worse. Document Released: 02/11/2000 Document Revised: 06/30/2013 Document Reviewed: 05/15/2011 [facility] Patient Information 2015 [place]. This information is not intended to replace advice given to you by your health care provider. Make sure you discuss any questions  you have with your health care provider.   Chest Pain (Nonspecific) It is often hard to give a diagnosis for the cause of chest pain. There is always a chance that your pain could be related to something serious, such as a  heart attack or a blood clot in the lungs. You need to follow up with your doctor. HOME CARE  If antibiotic medicine was given, take it as directed by your doctor. Finish the medicine even if you start to feel better.  For the next few days, avoid activities that bring on chest pain. Continue physical activities as told by your doctor.  Do not use any tobacco products. This includes cigarettes, chewing tobacco, and e-cigarettes.  Avoid drinking alcohol.  Only take medicine as told by your doctor.  Follow your doctor's suggestions for more testing if your chest pain does not go away.  Keep all doctor visits you made. GET HELP IF:  Your chest pain does not go away, even after treatment.  You have a rash with blisters on your chest.  You have a fever. GET HELP RIGHT AWAY IF:   You have more pain or pain that spreads to your arm, neck, jaw, back, or belly (abdomen).  You have shortness of breath.  You cough more than usual or cough up blood.  You have very bad back or belly pain.  You feel sick to your stomach (nauseous) or throw up (vomit).  You have very bad weakness.  You pass out (faint).  You have chills. This is an emergency. Do not wait to see if the problems will go away. Call your local emergency services (911 in [place] Do not drive yourself to the hospital. MAKE SURE YOU:   Understand these instructions.  Will watch your condition.  Will get help right away if you are not doing well or get worse. Document Released: 08/02/2007 Document Revised: 02/18/2013 Document Reviewed: 08/02/2007 [facility] Patient Information 2015 [place], [place]. This information is not intended to replace advice given to you by your health care provider. Make sure you discuss any questions you have with your health care provider.

## 2014-06-23 LAB — CULTURE, GROUP A STREP: STREP A CULTURE: NEGATIVE

## 2015-06-05 ENCOUNTER — Encounter (HOSPITAL_COMMUNITY): Payer: Self-pay | Admitting: Emergency Medicine

## 2015-06-05 ENCOUNTER — Emergency Department (HOSPITAL_COMMUNITY)
Admission: EM | Admit: 2015-06-05 | Discharge: 2015-06-05 | Disposition: A | Discharge status: Home / Self Care | Payer: BLUE CROSS/BLUE SHIELD | Attending: Emergency Medicine | Admitting: Emergency Medicine

## 2015-06-05 DIAGNOSIS — Z3202 Encounter for pregnancy test, result negative: Secondary | ICD-10-CM | POA: Diagnosis not present

## 2015-06-05 DIAGNOSIS — R112 Nausea with vomiting, unspecified: Secondary | ICD-10-CM | POA: Diagnosis not present

## 2015-06-05 DIAGNOSIS — R197 Diarrhea, unspecified: Secondary | ICD-10-CM | POA: Diagnosis present

## 2015-06-05 DIAGNOSIS — R1084 Generalized abdominal pain: Secondary | ICD-10-CM | POA: Diagnosis not present

## 2015-06-05 LAB — COMPREHENSIVE METABOLIC PANEL
ALBUMIN: 3.8 g/dL (ref 3.5–5.0)
ALT: 13 U/L — ABNORMAL LOW (ref 14–54)
AST: 17 U/L (ref 15–41)
Alkaline Phosphatase: 80 U/L (ref 38–126)
Anion gap: 8 (ref 5–15)
BILIRUBIN TOTAL: 0.5 mg/dL (ref 0.3–1.2)
BUN: 12 mg/dL (ref 6–20)
CHLORIDE: 105 mmol/L (ref 101–111)
CO2: 23 mmol/L (ref 22–32)
Calcium: 8.8 mg/dL — ABNORMAL LOW (ref 8.9–10.3)
Creatinine, Ser: 0.75 mg/dL (ref 0.44–1.00)
GFR calc Af Amer: >60 mL/min (ref >60)
GFR calc non Af Amer: >60 mL/min (ref >60)
GLUCOSE: 105 mg/dL — AB (ref 65–99)
POTASSIUM: 4.5 mmol/L (ref 3.5–5.1)
Sodium: 136 mmol/L (ref 135–145)
TOTAL PROTEIN: 7.7 g/dL (ref 6.5–8.1)

## 2015-06-05 LAB — URINALYSIS, ROUTINE W REFLEX MICROSCOPIC
BILIRUBIN URINE: NEGATIVE
Glucose, UA: NEGATIVE mg/dL
Ketones, ur: NEGATIVE mg/dL
LEUKOCYTES UA: NEGATIVE
Nitrite: NEGATIVE
PH: 7 (ref 5.0–8.0)
Protein, ur: NEGATIVE mg/dL
SPECIFIC GRAVITY, URINE: 1.023 (ref 1.005–1.030)

## 2015-06-05 LAB — CBC
HEMATOCRIT: 37.5 % (ref 36.0–46.0)
HEMOGLOBIN: 12.3 g/dL (ref 12.0–15.0)
MCH: 26.3 pg (ref 26.0–34.0)
MCHC: 32.8 g/dL (ref 30.0–36.0)
MCV: 80.3 fL (ref 78.0–100.0)
Platelets: 257 10*3/uL (ref 150–400)
RBC: 4.67 MIL/uL (ref 3.87–5.11)
RDW: 14.2 % (ref 11.5–15.5)
WBC: 8 10*3/uL (ref 4.0–10.5)

## 2015-06-05 LAB — URINE MICROSCOPIC-ADD ON
BACTERIA UA: NONE SEEN
WBC, UA: NONE SEEN WBC/hpf (ref 0–5)

## 2015-06-05 LAB — I-STAT BETA HCG BLOOD, ED (MC, WL, AP ONLY): I-stat hCG, quantitative: <5 m[IU]/mL (ref <5)

## 2015-06-05 LAB — LIPASE, BLOOD: LIPASE: 19 U/L (ref 11–51)

## 2015-06-05 MED ORDER — ONDANSETRON 4 MG PO TBDP: 4.0000 mg | ORAL_TABLET | Freq: Three times a day (TID) | ORAL | Status: DC | PRN

## 2015-06-05 MED ORDER — SODIUM CHLORIDE 0.9 % IV BOLUS (SEPSIS): 1000.0000 mL | Freq: Once | INTRAVENOUS | Status: DC

## 2015-06-05 MED ORDER — ONDANSETRON 4 MG PO TBDP
4.0000 mg | ORAL_TABLET | Freq: Once | ORAL | Status: AC | PRN
Start: 2015-06-05 — End: 2015-06-05
  Administered 2015-06-05: 4 mg via ORAL
  Filled 2015-06-05: qty 1

## 2015-06-05 MED ORDER — ONDANSETRON HCL 4 MG/2ML IJ SOLN: 4.0000 mg | Freq: Once | INTRAMUSCULAR | Status: DC

## 2015-06-05 MED ORDER — ONDANSETRON HCL 4 MG/2ML IJ SOLN
4.0000 mg | Freq: Once | INTRAMUSCULAR | Status: AC
  Administered 2015-06-05: 4 mg via INTRAVENOUS
  Filled 2015-06-05: qty 2

## 2015-06-05 MED ORDER — SODIUM CHLORIDE 0.9 % IV BOLUS (SEPSIS)
1000.0000 mL | Freq: Once | INTRAVENOUS | Status: AC
  Administered 2015-06-05: 1000 mL via INTRAVENOUS

## 2015-06-05 NOTE — ED Notes (Addendum)
Pt reports n/v/d and abd discomfort that began upon waking this am. Still having nausea in triage; otherwise feeling weak.

## 2015-06-05 NOTE — ED Notes (Signed)
Pt given water 

## 2015-06-05 NOTE — ED Provider Notes (Signed)
CSN: [id number]     Arrival date & time 06/05/15  1247 History   First MD Initiated Contact with Patient 06/05/15 1536     Chief Complaint  Patient presents with  . Emesis  . Diarrhea     (Consider location/radiation/quality/duration/timing/severity/associated sxs/prior Treatment) HPI Comments: Patient presents today with nausea, vomiting, and diarrhea.  She reports onset of symptoms this morning.  She states that she had four episodes of vomiting and six episodes of diarrhea.  No blood in her emesis or blood in her stool.  She reports diffuse intermittent abdominal pain associated with the vomiting.  She denies fever, chills, urinary symptoms, or any other symptoms at this time.  No medications given prior to arrival.  She was given Zofran ODT in Triage, which she reports helped somewhat.  No prior abdominal surgeries.  No recent foreign travel or hospitalizations.    Patient is a 25 y.o. female presenting with vomiting and diarrhea. The history is provided by the patient.  Emesis Associated symptoms: diarrhea   Diarrhea Associated symptoms: vomiting     Past Medical History  Diagnosis Date  . No pertinent past medical history    Past Surgical History  Procedure Laterality Date  . Wrist surgery     Family History  Problem Relation Age of Onset  . Anesthesia problems Neg Hx    Social History  Substance Use Topics  . Smoking status: Never Smoker   . Smokeless tobacco: Never Used  . Alcohol Use: No   OB History    Gravida Para Term Preterm AB TAB SAB Ectopic Multiple Living   1 1 1  0 0 0 0 0 0 1     Review of Systems  Gastrointestinal: Positive for vomiting and diarrhea.  All other systems reviewed and are negative.     Allergies  Review of patient's allergies indicates no known allergies.  Home Medications   Prior to Admission medications   Medication Sig Start Date End Date Taking? Authorizing Provider  etonogestrel (IMPLANON) 68 MG IMPL implant 1 each by  Subdermal route once.   Yes Historical Provider, MD   BP 144/74 mmHg  Pulse 93  Temp(Src) 98.4 F (36.9 C) (Oral)  Resp 18  SpO2 100%  LMP 05/14/2015 Physical Exam  Constitutional: She appears well-developed and well-nourished.  HENT:  Head: Normocephalic and atraumatic.  Mouth/Throat: Oropharynx is clear and moist.  Neck: Normal range of motion. Neck supple.  Cardiovascular: Normal rate, regular rhythm and normal heart sounds.   Pulmonary/Chest: Effort normal and breath sounds normal.  Abdominal: Soft. Bowel sounds are normal. She exhibits no distension and no mass. There is no tenderness. There is no rebound and no guarding.  Musculoskeletal: Normal range of motion.  Neurological: She is alert.  Skin: Skin is warm and dry.  Psychiatric: She has a normal mood and affect.  Nursing note and vitals reviewed.   ED Course  Procedures (including critical care time) Labs Review Labs Reviewed  COMPREHENSIVE METABOLIC PANEL - Abnormal; Notable for the following:    Glucose, Bld 105 (*)    Calcium 8.8 (*)    ALT 13 (*)    All other components within normal limits  LIPASE, BLOOD  CBC  URINALYSIS, ROUTINE W REFLEX MICROSCOPIC (NOT AT [facility])  I-STAT BETA HCG BLOOD, ED ([facility], [facility], [facility] ONLY)    Imaging Review No results found. I have personally reviewed and evaluated these images and lab results as part of my medical decision-making.   EKG Interpretation None  5:28 PM Reassessed patient.  Nausea improved.  No vomiting in the ED.  Tolerating PO liquids. MDM   Final diagnoses:  None   Patient presents today with nausea, vomiting, and diarrhea.  Onset of symptoms this morning.  Labs unremarkable.  Abdomen is soft and nontender.  Symptoms improved in the ED.  Patient tolerating PO liquids prior to discharge.  Feel that the patient is stable for discharge.  Suspect Viral Gastroenteritis.  Patient discharged home with Rx for Zofran.  Stable for discharge.  Return precautions  given.    Santiago Glad, PA-C 06/05/15 1904  Arby Barrette, MD 06/11/15 [phone]

## 2015-12-10 ENCOUNTER — Ambulatory Visit (INDEPENDENT_AMBULATORY_CARE_PROVIDER_SITE_OTHER): Payer: PRIVATE HEALTH INSURANCE | Admitting: Obstetrics

## 2015-12-10 ENCOUNTER — Encounter: Payer: Self-pay | Admitting: Obstetrics

## 2015-12-10 VITALS — BP 138/72 | HR 70 | Temp 98.0°F | Ht 64.0 in | Wt 303.9 lb

## 2015-12-10 DIAGNOSIS — Z113 Encounter for screening for infections with a predominantly sexual mode of transmission: Secondary | ICD-10-CM

## 2015-12-10 DIAGNOSIS — N760 Acute vaginitis: Secondary | ICD-10-CM

## 2015-12-10 DIAGNOSIS — Z01419 Encounter for gynecological examination (general) (routine) without abnormal findings: Secondary | ICD-10-CM | POA: Diagnosis not present

## 2015-12-10 DIAGNOSIS — Z124 Encounter for screening for malignant neoplasm of cervix: Secondary | ICD-10-CM | POA: Diagnosis not present

## 2015-12-10 DIAGNOSIS — Z3009 Encounter for other general counseling and advice on contraception: Secondary | ICD-10-CM | POA: Diagnosis not present

## 2015-12-10 NOTE — Progress Notes (Signed)
Subjective:        Kathy Hayes is a 25 y.o. female here for a routine exam.  Current complaints: None.    Personal health questionnaire:  Is patient Ashkenazi Jewish, have a family history of breast and/or ovarian cancer: no Is there a family history of uterine cancer diagnosed at age < 6950, gastrointestinal cancer, urinary tract cancer, family member who is a Personnel officerLynch syndrome-associated carrier: no Is the patient overweight and hypertensive, family history of diabetes, personal history of gestational diabetes, preeclampsia or PCOS: no Is patient over 2055, have PCOS,  family history of premature CHD under age 25, diabetes, smoke, have hypertension or peripheral artery disease:  no At any time, has a partner hit, kicked or otherwise hurt or frightened you?: no Over the past 2 weeks, have you felt down, depressed or hopeless?: no Over the past 2 weeks, have you felt little interest or pleasure in doing things?:no   Gynecologic History Patient's last menstrual period was 12/04/2015. Contraception: Nexplanon Last Pap: ~2013. Results were: normal Last mammogram: n/a. Results were: n/a  Obstetric History OB History  Gravida Para Term Preterm AB Living  1 1 1  0 0 1  SAB TAB Ectopic Multiple Live Births  0 0 0 0 1    # Outcome Date GA Lbr Len/2nd Weight Sex Delivery Anes PTL Lv  1 Term 08/01/11 [id number] 05:33 / 00:26  M Vag-Spont None  LIV     Birth Comments: none      Past Medical History:  Diagnosis Date  . No pertinent past medical history     Past Surgical History:  Procedure Laterality Date  . WRIST SURGERY       Current Outpatient Prescriptions:  .  etonogestrel (IMPLANON) 68 MG IMPL implant, 1 each by Subdermal route once., Disp: , Rfl:  .  ondansetron (ZOFRAN ODT) 4 MG disintegrating tablet, Take 1 tablet (4 mg total) by mouth every 8 (eight) hours as needed for nausea or vomiting. (Patient not taking: Reported on 12/10/2015), Disp: 20 tablet, Rfl: 0 No Known Allergies   Social History  Substance Use Topics  . Smoking status: Never Smoker  . Smokeless tobacco: Never Used  . Alcohol use No    Family History  Problem Relation Age of Onset  . Anesthesia problems Neg Hx       Review of Systems  Constitutional: negative for fatigue and weight loss Respiratory: negative for cough and wheezing Cardiovascular: negative for chest pain, fatigue and palpitations Gastrointestinal: negative for abdominal pain and change in bowel habits Musculoskeletal:negative for myalgias Neurological: negative for gait problems and tremors Behavioral/Psych: negative for abusive relationship, depression Endocrine: negative for temperature intolerance   Genitourinary:negative for abnormal menstrual periods, genital lesions, hot flashes, sexual problems and vaginal discharge Integument/breast: negative for breast lump, breast tenderness, nipple discharge and skin lesion(s)    Objective:       BP 138/72   Pulse 70   Temp 98 F (36.7 C) (Oral)   Ht 5\' 4"  (1.626 m)   Wt (!) 303 lb 14.4 oz (137.8 kg)   LMP 12/04/2015   BMI 52.16 kg/m  General:   alert  Skin:   no rash or abnormalities  Lungs:   clear to auscultation bilaterally  Heart:   regular rate and rhythm, S1, S2 normal, no murmur, click, rub or gallop  Breasts:   normal without suspicious masses, skin or nipple changes or axillary nodes  Abdomen:  normal findings: no organomegaly, soft, non-tender and  no hernia  Pelvis:  External genitalia: normal general appearance Urinary system: urethral meatus normal and bladder without fullness, nontender Vaginal: normal without tenderness, induration or masses Cervix: normal appearance Adnexa: normal bimanual exam Uterus: anteverted and non-tender, normal size   Lab Review Urine pregnancy test Labs reviewed yes Radiologic studies reviewed no  50% of 20 min visit spent on counseling and coordination of care.   Assessment:    Healthy female exam.     Contraceptive surveillance.  Wants to continue Nexplanon.   Plan:    Education reviewed: low fat, low cholesterol diet, safe sex/STD prevention, self breast exams and weight bearing exercise.   No orders of the defined types were placed in this encounter.  No orders of the defined types were placed in this encounter.    Patient ID: Kathy Hayes, female   DOB: [birth year], 25 y.o.   MRN: [id number]

## 2015-12-10 NOTE — Addendum Note (Signed)
Addended by: Francene FindersJAMES, Vella Colquitt C on: 12/10/2015 10:42 AM   Modules accepted: Orders

## 2015-12-13 LAB — CYTOLOGY - PAP: Diagnosis: NEGATIVE

## 2015-12-13 LAB — CERVICOVAGINAL ANCILLARY ONLY
Chlamydia: NEGATIVE
Neisseria Gonorrhea: NEGATIVE
TRICH (WINDOWPATH): NEGATIVE

## 2015-12-15 LAB — CERVICOVAGINAL ANCILLARY ONLY: CANDIDA VAGINITIS: NEGATIVE

## 2015-12-16 ENCOUNTER — Other Ambulatory Visit: Payer: Self-pay | Admitting: Obstetrics

## 2015-12-16 MED ORDER — METRONIDAZOLE 500 MG PO TABS: 500.0000 mg | ORAL_TABLET | Freq: Two times a day (BID) | ORAL | 2 refills | Status: DC

## 2015-12-23 ENCOUNTER — Ambulatory Visit (INDEPENDENT_AMBULATORY_CARE_PROVIDER_SITE_OTHER): Payer: BLUE CROSS/BLUE SHIELD | Admitting: Obstetrics

## 2015-12-23 ENCOUNTER — Encounter: Payer: Self-pay | Admitting: Obstetrics

## 2015-12-23 VITALS — BP 123/73 | HR 65 | Temp 97.7°F | Ht 63.0 in | Wt 302.2 lb

## 2015-12-23 DIAGNOSIS — Z3046 Encounter for surveillance of implantable subdermal contraceptive: Secondary | ICD-10-CM

## 2015-12-23 DIAGNOSIS — Z30017 Encounter for initial prescription of implantable subdermal contraceptive: Secondary | ICD-10-CM

## 2015-12-23 NOTE — Progress Notes (Signed)
NEXPLANON REMOVAL NOTE  Date of LMP:   unknown  Contraception used: *Nexplanon   Indications:  The patient desires contraception.  She understands risks, benefits, and alternatives to Implanon and would like to proceed.  Anesthesia:   Lidocaine 1% plain.  Procedure:  A time-out was performed confirming the procedure and the patient's allergy status.  Complications: None                      The rod was palpated and the area was sterilely prepped.  The area beneath the distal tip was anesthetized with 1% xylocaine and the skin incised                       Over the tip and the tip was exposed, grasped with forcep and removed intact.  A single suture of 4-0 Vicryl was used to close incision.  Steri strip                       And a bandage applied and the arm was wrapped with gauze bandage.  The patient tolerated well.  Instructions:  The patient was instructed to remove the dressing in 24 hours and that some bruising is to be expected.  She was advised to use over the counter analgesics as needed for any pain at the site.  She is to keep the area dry for 24 hours and to call if her hand or arm becomes cold, numb, or blue.     Nexplanon Procedure Note   PRE-OP DIAGNOSIS: desired long-term, reversible contraception  POST-OP DIAGNOSIS: Same  PROCEDURE: Nexplanon  placement Performing Provider:  Bing Neighborsharles A. Clearance CootsHarper MD  Patient education prior to procedure, explained risk, benefits of Nexplanon, reviewed alternative options. Patient reported understanding. Gave consent to continue with procedure.   PROCEDURE:  Pregnancy Text :  Negative Site (check):      left arm         Sterile Preparation:   Betadinex3 Lot # [id number] Expiration Date 05 / 2019  Insertion site was selected 8 - 10 cm from medial epicondyle and marked along with guiding site using sterile marker. Procedure area was prepped and draped in a sterile fashion. 1% Lidocaine 1.5 ml given prior to procedure. Nexplanon  was inserted  subcutaneously.Needle was removed from the insertion site. Nexplanon capsule was palpated by provider and patient to assure satisfactory placement. Dressing applied.  Followup: The patient tolerated the procedure well without complications.  Standard post-procedure care is explained and return precautions are given.  Marcellus Pulliam A. Clearance CootsHarper MD

## 2016-01-07 ENCOUNTER — Ambulatory Visit (INDEPENDENT_AMBULATORY_CARE_PROVIDER_SITE_OTHER): Payer: BLUE CROSS/BLUE SHIELD | Admitting: Obstetrics

## 2016-01-07 ENCOUNTER — Encounter: Payer: Self-pay | Admitting: Obstetrics

## 2016-01-07 VITALS — BP 121/82 | HR 69 | Temp 98.0°F | Ht 63.0 in | Wt 301.5 lb

## 2016-01-07 DIAGNOSIS — Z3046 Encounter for surveillance of implantable subdermal contraceptive: Secondary | ICD-10-CM | POA: Diagnosis not present

## 2016-01-07 NOTE — Progress Notes (Signed)
Subjective:    Kathy Hayes is a 25 y.o. female who presents for 2 week check up after Nexplanon insertion. The patient has no complaints today. The patient is sexually active. Pertinent past medical history: none.  The information documented in the HPI was reviewed and verified.  Menstrual History: OB History    Gravida Para Term Preterm AB Living   1 1 1  0 0 1   SAB TAB Ectopic Multiple Live Births   0 0 0 0 1       Patient's last menstrual period was 12/04/2015.   There are no active problems to display for this patient.  Past Medical History:  Diagnosis Date  . No pertinent past medical history     Past Surgical History:  Procedure Laterality Date  . WRIST SURGERY       Current Outpatient Prescriptions:  .  etonogestrel (IMPLANON) 68 MG IMPL implant, 1 each by Subdermal route once., Disp: , Rfl:  .  metroNIDAZOLE (FLAGYL) 500 MG tablet, Take 1 tablet (500 mg total) by mouth 2 (two) times daily. (Patient not taking: Reported on 01/07/2016), Disp: 14 tablet, Rfl: 2 .  ondansetron (ZOFRAN ODT) 4 MG disintegrating tablet, Take 1 tablet (4 mg total) by mouth every 8 (eight) hours as needed for nausea or vomiting. (Patient not taking: Reported on 01/07/2016), Disp: 20 tablet, Rfl: 0 No Known Allergies  Social History  Substance Use Topics  . Smoking status: Never Smoker  . Smokeless tobacco: Never Used  . Alcohol use No    Family History  Problem Relation Age of Onset  . Anesthesia problems Neg Hx        Review of Systems Constitutional: negative for weight loss Genitourinary:negative for abnormal menstrual periods and vaginal discharge   Objective:   BP 121/82   Pulse 69   Temp 98 F (36.7 C) (Oral)   Ht 5\' 3"  (1.6 m)   Wt (!) 301 lb 8 oz (136.8 kg)   LMP 12/04/2015   BMI 53.41 kg/m    PE:          General:  Alert and no distress        Left upper extremity:  Rod palpated, intact.  Insertion site clean, dry, intact and non tender.  Lab Review Urine  pregnancy test Labs reviewed yes Radiologic studies reviewed no  50% of 15 min visit spent on counseling and coordination of care.    Assessment:    25 y.o., continuing Nexplanon, no contraindications.   Plan:    All questions answered. Contraception: Nexplanon. Follow up in 1 year.  Pap.  No orders of the defined types were placed in this encounter.  No orders of the defined types were placed in this encounter.

## 2016-01-11 ENCOUNTER — Encounter: Payer: Self-pay | Admitting: Obstetrics

## 2016-09-03 IMAGING — CR DG CHEST 2V
2 series · 2 of 2 positions shown · non-contrast
Comparison: None.

CLINICAL DATA: Chest pain which began earlier today.  Slight cough.

EXAM:
CHEST  2 VIEW

[w chest pa]
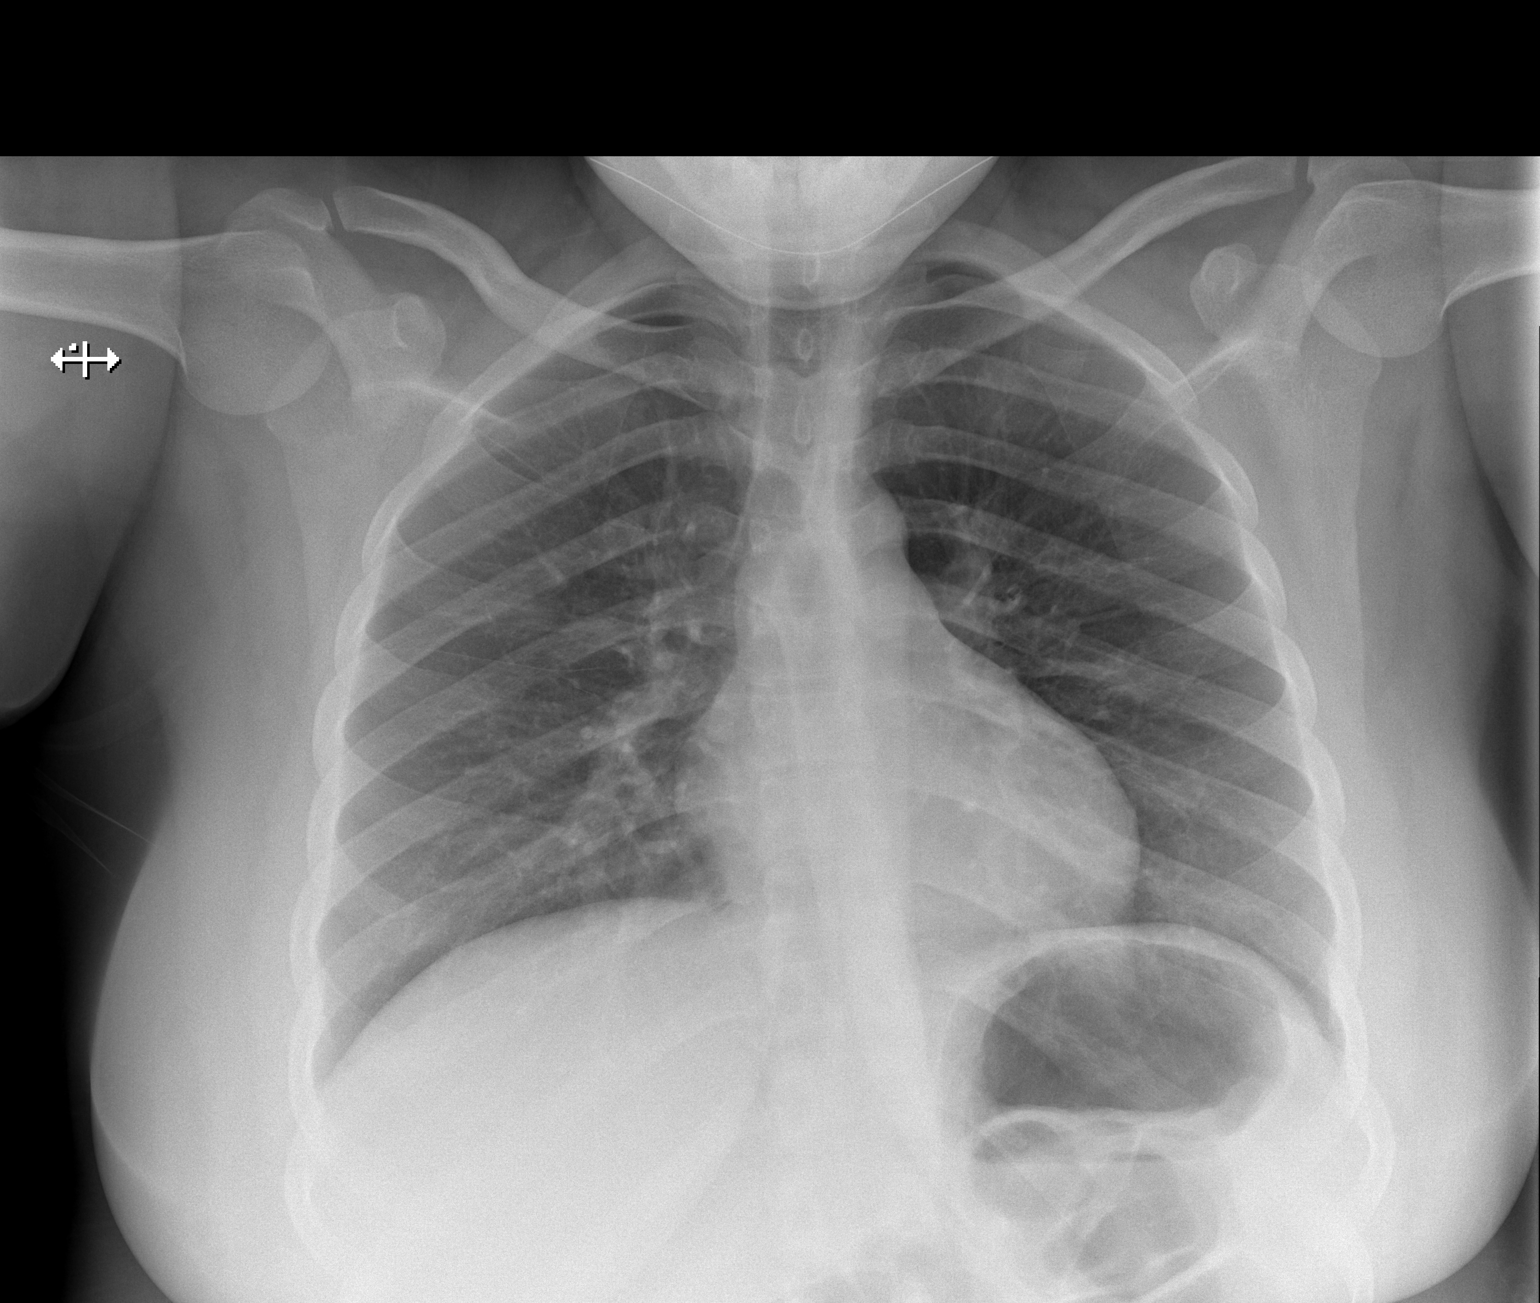

[w chest lat]
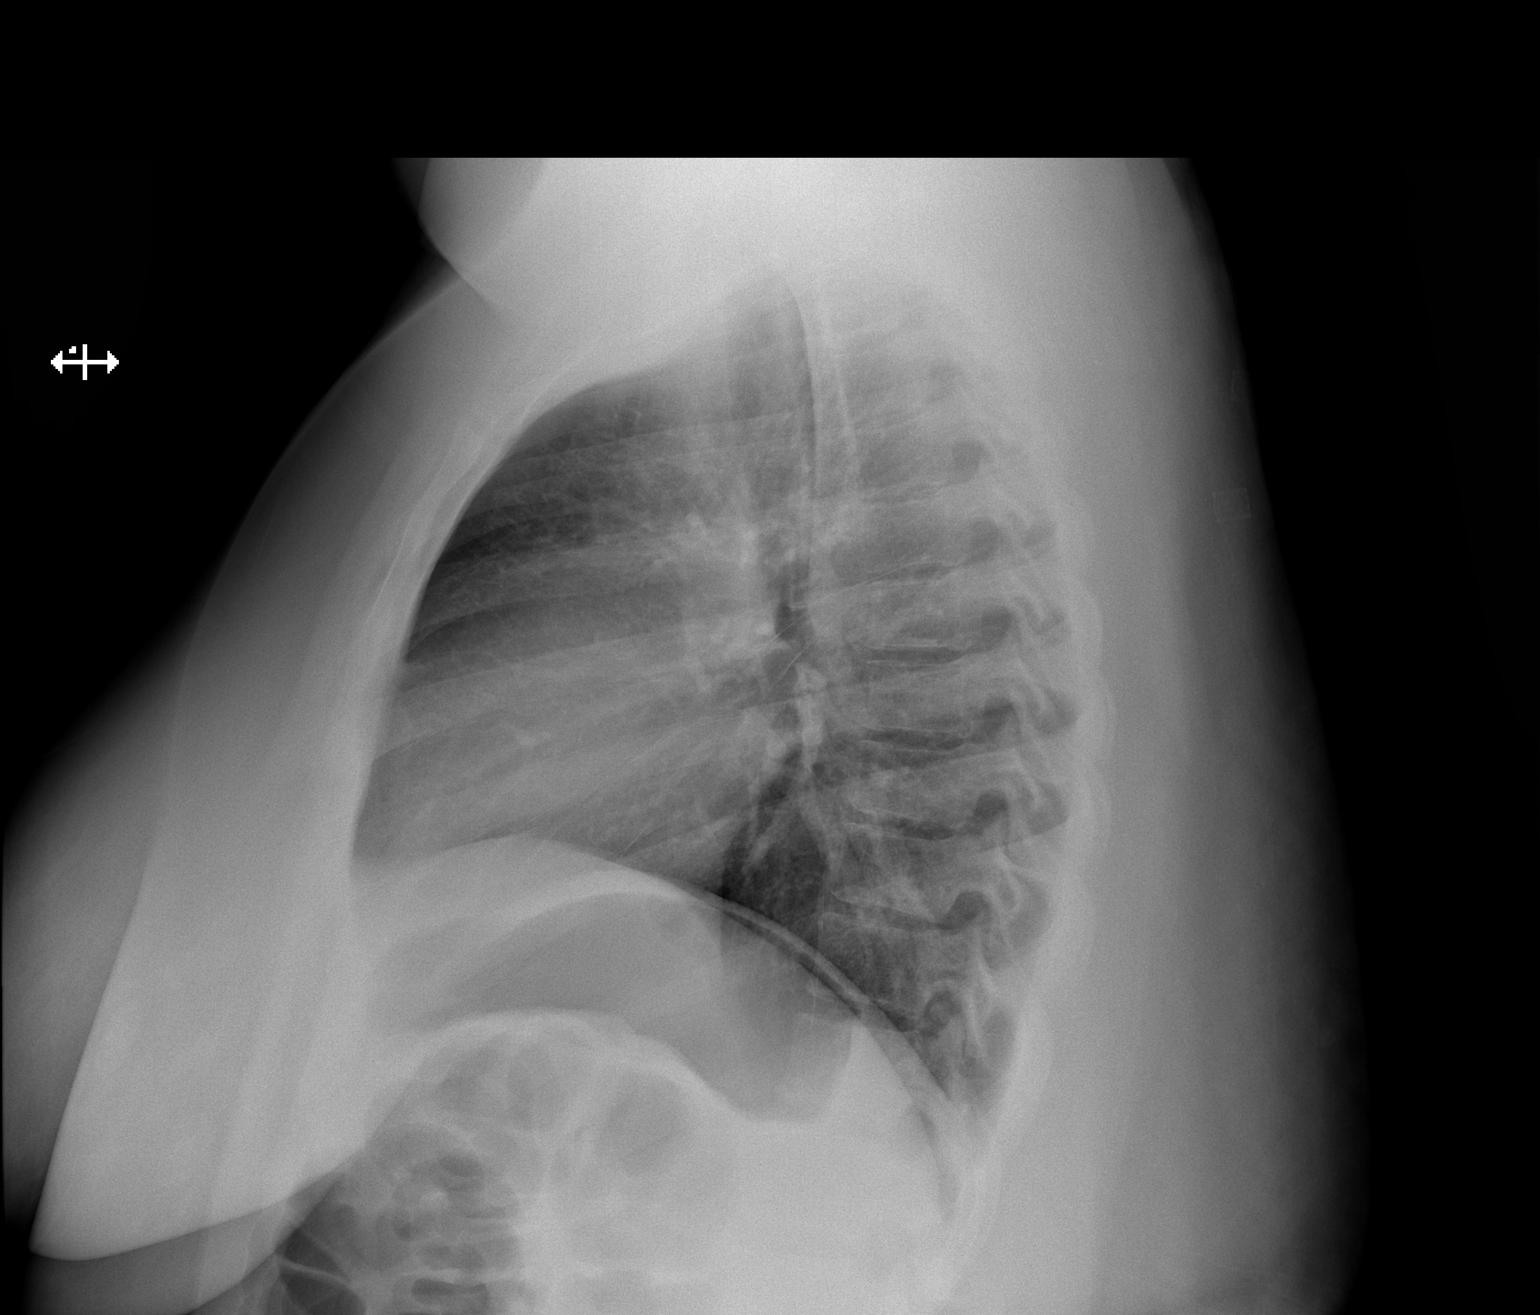

[2 of 2 positions shown; findings below may reference images not displayed]

FINDINGS: The heart size and mediastinal contours are within normal limits.
Both lungs are clear. The visualized skeletal structures are
unremarkable.
IMPRESSION: No active cardiopulmonary disease.

## 2016-09-04 IMAGING — CT CT ANGIO CHEST
1 of 2 series · 19 of 32 positions shown · IV contrast (OMNIPAQUE 300)
Comparison: Chest radiograph performed 06/20/2014

CLINICAL DATA: Acute onset of substernal chest aching and
epigastric abdominal pain. Diarrhea and sore throat. Elevated
D-dimer. Initial encounter.

EXAM:
CT ANGIOGRAPHY CHEST WITH CONTRAST
TECHNIQUE: Multidetector CT imaging of the chest was performed using the
standard protocol during bolus administration of intravenous
contrast. Multiplanar CT image reconstructions and MIPs were
obtained to evaluate the vascular anatomy.
CONTRAST:  100mL OMNIPAQUE IOHEXOL 350 MG/ML SOLN

[Series 6: thins for pacs · axial · 0.74mm/px · z∈[-270,-50]mm · 19 of 242 slices shown]
[im 11/242  lung]
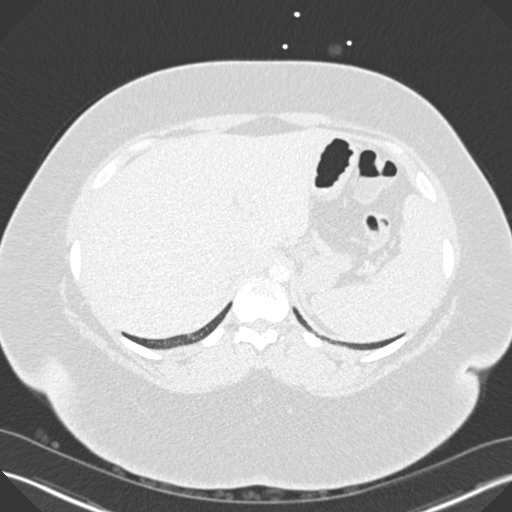
[im 21/242  soft-tissue]
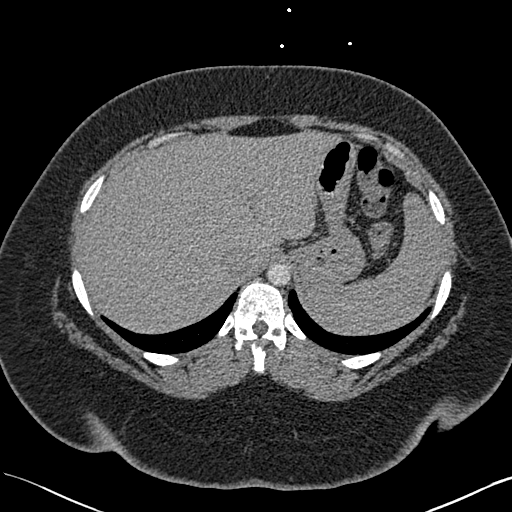
[im 32/242  lung]
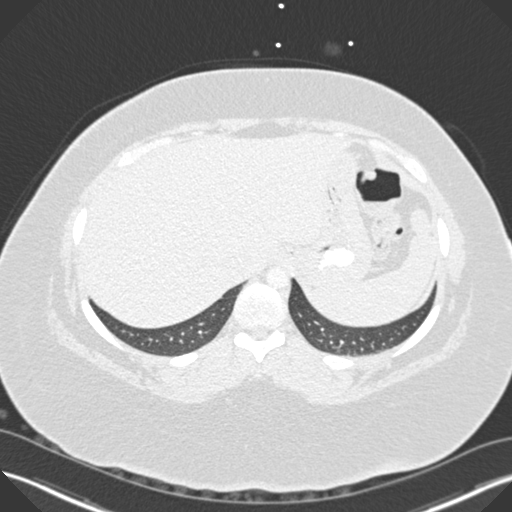
[im 53/242  soft-tissue]
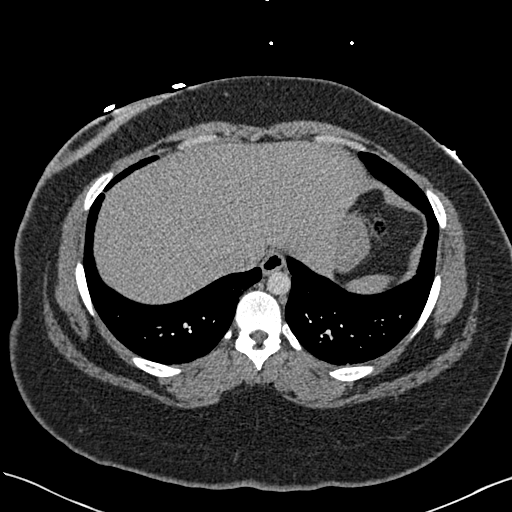
[im 63/242  lung]
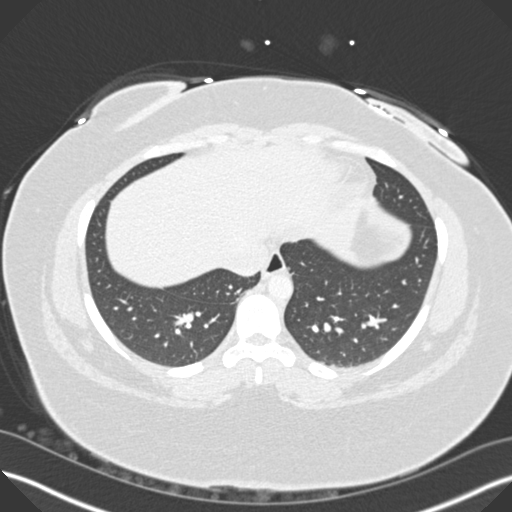
[im 74/242  soft-tissue]
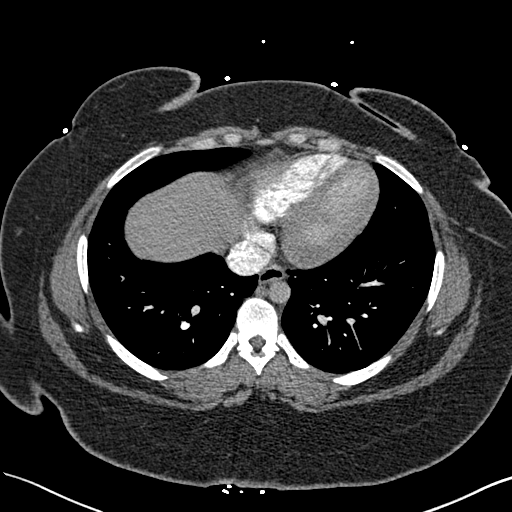
[im 84/242  lung]
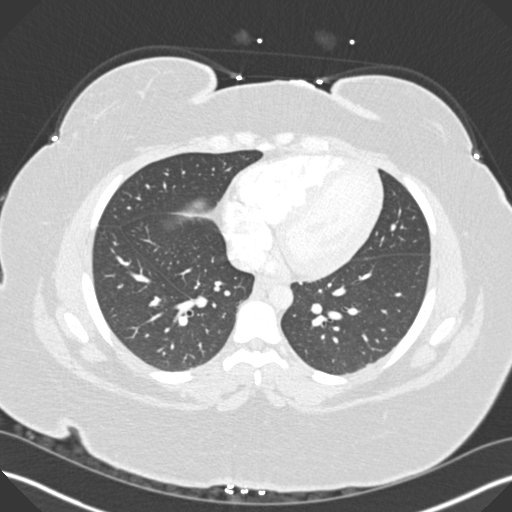
[im 95/242  soft-tissue]
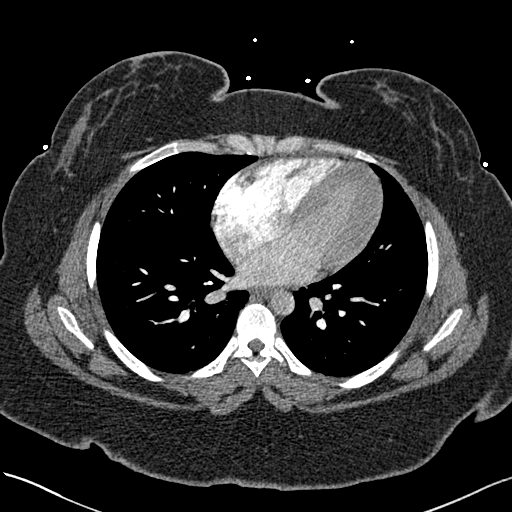
[im 105/242  lung]
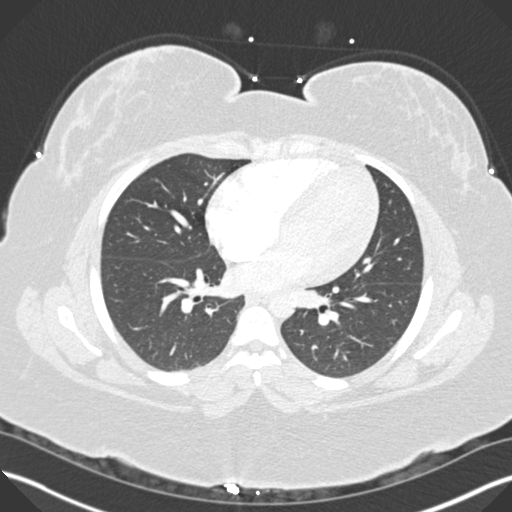
[im 126/242  soft-tissue]
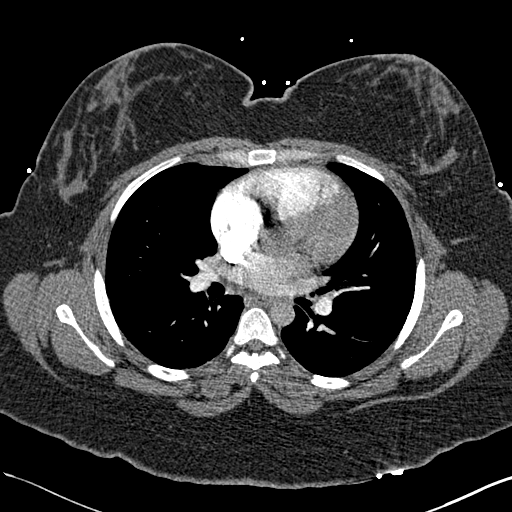
[im 137/242  lung]
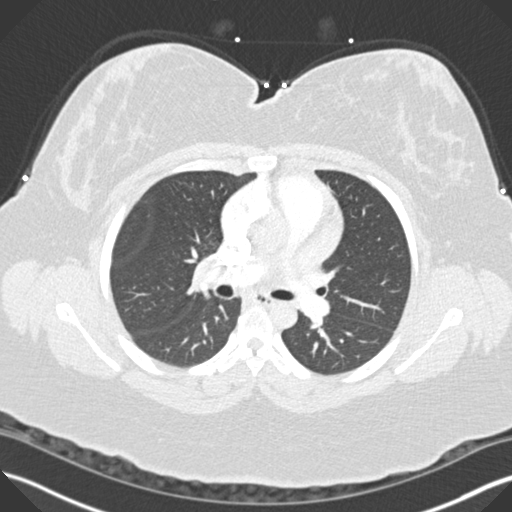
[im 147/242  soft-tissue]
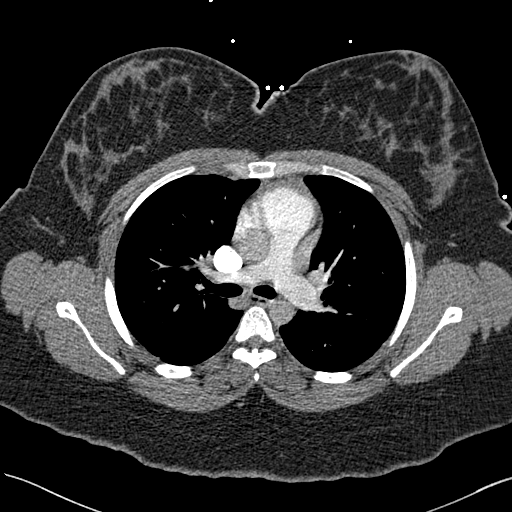
[im 158/242  lung]
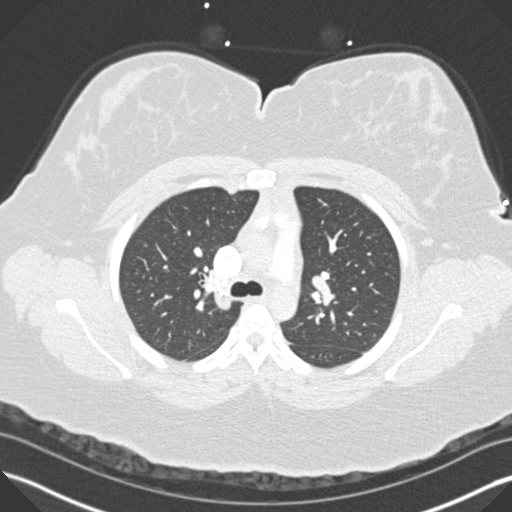
[im 168/242  soft-tissue]
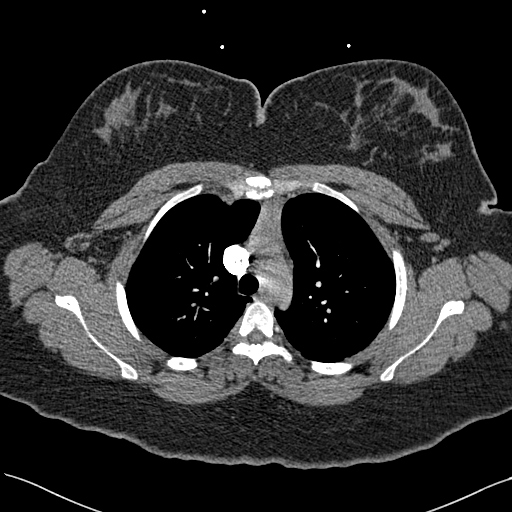
[im 179/242  lung]
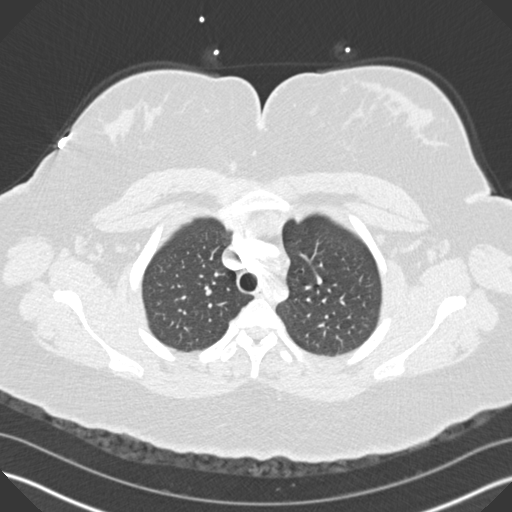
[im 189/242  soft-tissue]
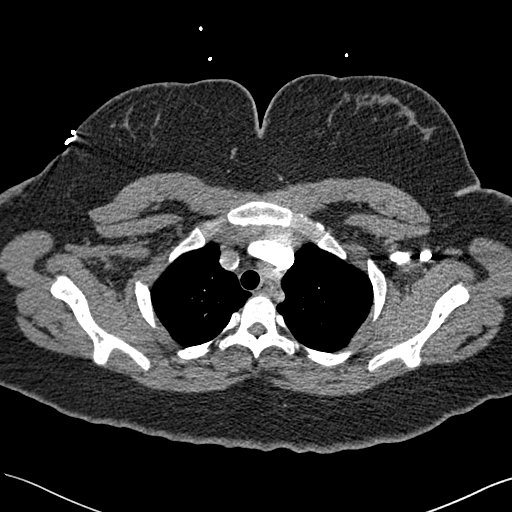
[im 210/242  lung]
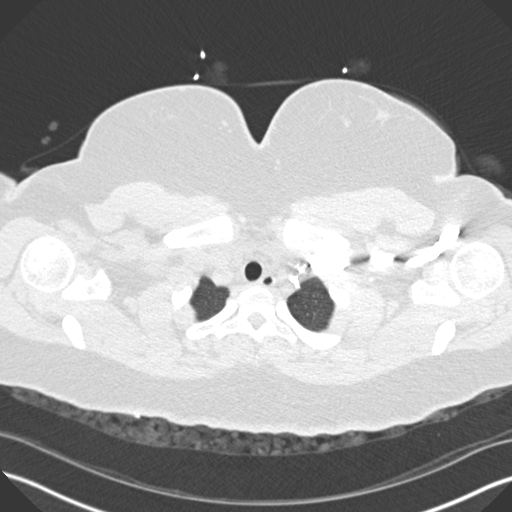
[im 221/242  soft-tissue]
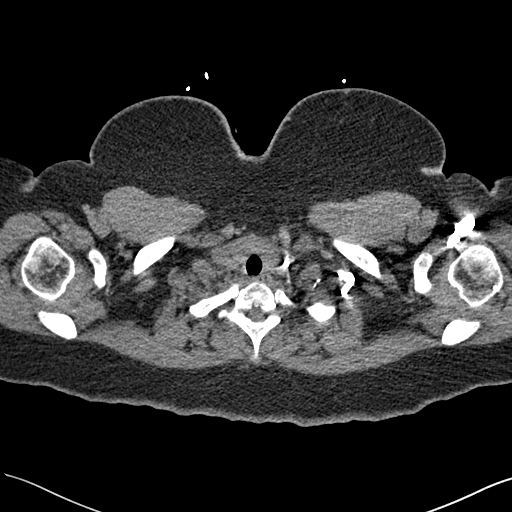
[im 231/242  lung]
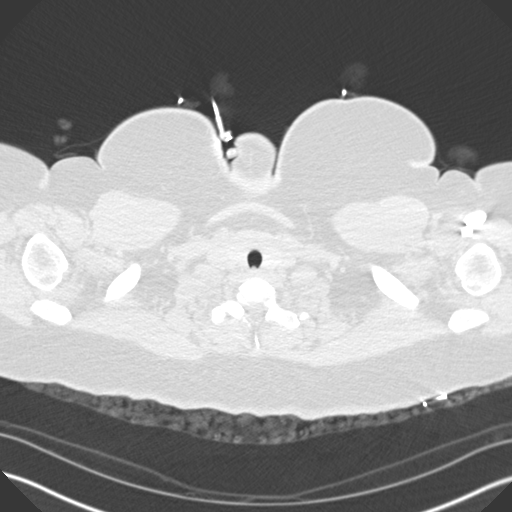

[19 of 32 positions shown; findings below may reference images not displayed]

FINDINGS: There is no evidence of pulmonary embolus.

The lungs are clear bilaterally. There is no evidence of significant
focal consolidation, pleural effusion or pneumothorax. No masses are
identified; no abnormal focal contrast enhancement is seen.

The mediastinum is unremarkable in appearance. No mediastinal
lymphadenopathy is seen. No pericardial effusion is identified. The
great vessels are grossly unremarkable in appearance. No axillary
lymphadenopathy is seen. The visualized portions of the thyroid
gland are unremarkable in appearance.

The visualized portions of the liver and spleen are unremarkable.

No acute osseous abnormalities are seen.

Review of the MIP images confirms the above findings.
IMPRESSION: 1. No evidence of pulmonary embolus.
2. Lungs clear bilaterally.

## 2017-03-23 DIAGNOSIS — Z0000 Encounter for general adult medical examination without abnormal findings: Secondary | ICD-10-CM | POA: Diagnosis not present

## 2019-01-08 ENCOUNTER — Other Ambulatory Visit: Payer: Self-pay

## 2019-01-08 DIAGNOSIS — Z20822 Contact with and (suspected) exposure to covid-19: Secondary | ICD-10-CM

## 2019-01-11 LAB — NOVEL CORONAVIRUS, NAA: SARS-CoV-2, NAA: DETECTED — AB

## 2019-01-15 ENCOUNTER — Ambulatory Visit: Payer: BC Managed Care – PPO | Admitting: Obstetrics

## 2019-01-24 DIAGNOSIS — Z20828 Contact with and (suspected) exposure to other viral communicable diseases: Secondary | ICD-10-CM | POA: Diagnosis not present

## 2019-01-31 ENCOUNTER — Ambulatory Visit: Payer: BC Managed Care – PPO | Admitting: Obstetrics

## 2019-02-14 ENCOUNTER — Encounter: Payer: Self-pay | Admitting: Obstetrics

## 2019-02-14 ENCOUNTER — Ambulatory Visit (INDEPENDENT_AMBULATORY_CARE_PROVIDER_SITE_OTHER): Payer: BC Managed Care – PPO | Admitting: Obstetrics

## 2019-02-14 ENCOUNTER — Other Ambulatory Visit: Payer: Self-pay

## 2019-02-14 VITALS — Wt 327.0 lb

## 2019-02-14 DIAGNOSIS — Z124 Encounter for screening for malignant neoplasm of cervix: Secondary | ICD-10-CM

## 2019-02-14 DIAGNOSIS — N898 Other specified noninflammatory disorders of vagina: Secondary | ICD-10-CM

## 2019-02-14 DIAGNOSIS — B9689 Other specified bacterial agents as the cause of diseases classified elsewhere: Secondary | ICD-10-CM

## 2019-02-14 DIAGNOSIS — Z01419 Encounter for gynecological examination (general) (routine) without abnormal findings: Secondary | ICD-10-CM | POA: Diagnosis not present

## 2019-02-14 DIAGNOSIS — Z113 Encounter for screening for infections with a predominantly sexual mode of transmission: Secondary | ICD-10-CM

## 2019-02-14 DIAGNOSIS — N760 Acute vaginitis: Secondary | ICD-10-CM

## 2019-02-14 DIAGNOSIS — Z3046 Encounter for surveillance of implantable subdermal contraceptive: Secondary | ICD-10-CM | POA: Diagnosis not present

## 2019-02-14 NOTE — Progress Notes (Addendum)
[place] REMOVAL NOTE  Date of LMP:   unknown  Contraception used: *Nexplanon   Indications:  The patient desires contraception.  She understands risks, benefits, and alternatives to Implanon and would like to proceed.  Anesthesia:   Lidocaine 1% plain.  Procedure:  A time-out was performed confirming the procedure and the patient's allergy status.  Complications: None                      The rod was palpated and the area was sterilely prepped.  The area beneath the distal tip was anesthetized with 1% xylocaine and the skin incised                       Over the tip and the tip was exposed, grasped with forcep and removed intact.  A single suture of 4-0 Vicryl was used to close incision.  Steri strip                       And a bandage applied and the arm was wrapped with gauze bandage.  The patient tolerated well.  Instructions:  The patient was instructed to remove the dressing in 24 hours and that some bruising is to be expected.  She was advised to use over the counter analgesics as needed for any pain at the site.  She is to keep the area dry for 24 hours and to call if her hand or arm becomes cold, numb, or blue.  Return visit:  Return in 2 weeks      Subjective:        Kathy Hayes is a 28 y.o. female here for a routine exam.  Current complaints: None.    Personal health questionnaire:  Is patient Ashkenazi Jewish, have a family history of breast and/or ovarian cancer: no Is there a family history of uterine cancer diagnosed at age < 44, gastrointestinal cancer, urinary tract cancer, family member who is a Field seismologist syndrome-associated carrier: no Is the patient overweight and hypertensive, family history of diabetes, personal history of gestational diabetes, preeclampsia or PCOS: no Is patient over 32, have PCOS,  family history of premature CHD under age 74, diabetes, smoke, have hypertension or peripheral artery disease:  no At any time, has a partner hit, kicked or otherwise  hurt or frightened you?: no Over the past 2 weeks, have you felt down, depressed or hopeless?: no Over the past 2 weeks, have you felt little interest or pleasure in doing things?:no   Gynecologic History No LMP recorded. Contraception: Nexplanon Last Pap: 12-10-2015. Results were: normal Last mammogram: n/a. Results were: n/a  Obstetric History OB History  Gravida Para Term Preterm AB Living  1 1 1  0 0 1  SAB TAB Ectopic Multiple Live Births  0 0 0 0 1    # Outcome Date GA Lbr Len/2nd Weight Sex Delivery Anes PTL Lv  1 Term 08/01/11 [redacted]w[redacted]d 05:33 / 00:26  M Vag-Spont None  LIV     Birth Comments: none    Past Medical History:  Diagnosis Date  . No pertinent past medical history     Past Surgical History:  Procedure Laterality Date  . WRIST SURGERY       Current Outpatient Medications:  .  tinidazole (TINDAMAX) 500 MG tablet, Take 2 tablets (1,000 mg total) by mouth daily with breakfast., Disp: 10 tablet, Rfl: 2 No Known Allergies  Social History   Tobacco Use  .  Smoking status: Never Smoker  . Smokeless tobacco: Never Used  Substance Use Topics  . Alcohol use: No    Family History  Problem Relation Age of Onset  . Anesthesia problems Neg Hx       Review of Systems  Constitutional: negative for fatigue and weight loss Respiratory: negative for cough and wheezing Cardiovascular: negative for chest pain, fatigue and palpitations Gastrointestinal: negative for abdominal pain and change in bowel habits Musculoskeletal:negative for myalgias Neurological: negative for gait problems and tremors Behavioral/Psych: negative for abusive relationship, depression Endocrine: negative for temperature intolerance    Genitourinary:negative for abnormal menstrual periods, genital lesions, hot flashes, sexual problems and vaginal discharge Integument/breast: negative for breast lump, breast tenderness, nipple discharge and skin lesion(s)    Objective:       Wt (!) 327 lb  (148.3 kg)   BMI 57.93 kg/m  General:   alert  Skin:   no rash or abnormalities  Lungs:   clear to auscultation bilaterally  Heart:   regular rate and rhythm, S1, S2 normal, no murmur, click, rub or gallop  Breasts:   normal without suspicious masses, skin or nipple changes or axillary nodes  Abdomen:  normal findings: no organomegaly, soft, non-tender and no hernia  Pelvis:  External genitalia: normal general appearance Urinary system: urethral meatus normal and bladder without fullness, nontender Vaginal: normal without tenderness, induration or masses Cervix: normal appearance Adnexa: normal bimanual exam Uterus: anteverted and non-tender, normal size   Lab Review Urine pregnancy test Labs reviewed yes Radiologic studies reviewed no  50% of 25 min visit spent on counseling and coordination of care.   Assessment:     1. Encounter for annual routine gynecological examination  2. Cervical cancer screening Rx: - Cytology - PAP( [facility])  3. Screening examination for STD (sexually transmitted disease) Rx: - Cervicovaginal ancillary only( [facility])  4. Nexplanon removal    Plan:    Education reviewed: calcium supplements, depression evaluation, low fat, low cholesterol diet, safe sex/STD prevention, self breast exams and weight bearing exercise. Contraception: none. Follow up in: 2 weeks.   No orders of the defined types were placed in this encounter.  No orders of the defined types were placed in this encounter.   Brock Bad, MD 03/03/2019 11:00 AM

## 2019-02-17 LAB — CYTOLOGY - PAP: Diagnosis: NEGATIVE

## 2019-02-18 ENCOUNTER — Telehealth: Payer: Self-pay

## 2019-02-18 ENCOUNTER — Other Ambulatory Visit: Payer: Self-pay | Admitting: Obstetrics

## 2019-02-18 DIAGNOSIS — B9689 Other specified bacterial agents as the cause of diseases classified elsewhere: Secondary | ICD-10-CM

## 2019-02-18 LAB — CERVICOVAGINAL ANCILLARY ONLY
Bacterial Vaginitis (gardnerella): POSITIVE — AB
Candida Glabrata: NEGATIVE
Candida Vaginitis: NEGATIVE
Chlamydia: NEGATIVE
Comment: NEGATIVE
Comment: NEGATIVE
Comment: NEGATIVE
Comment: NEGATIVE
Comment: NEGATIVE
Comment: NORMAL
Neisseria Gonorrhea: NEGATIVE
Trichomonas: NEGATIVE

## 2019-02-18 MED ORDER — TINIDAZOLE 500 MG PO TABS: 1000.0000 mg | ORAL_TABLET | Freq: Every day | ORAL | 2 refills | Status: DC

## 2019-02-18 NOTE — Telephone Encounter (Signed)
-----   Message from Shelly Bombard, MD sent at 02/18/2019  2:29 PM EST ----- Tindamax Rx for BV

## 2019-02-18 NOTE — Telephone Encounter (Signed)
TC to pt to make aware of results pt not ava left detailed message for pt to return call to office.

## 2019-03-03 ENCOUNTER — Encounter: Payer: Self-pay | Admitting: Obstetrics

## 2019-03-03 ENCOUNTER — Ambulatory Visit: Payer: BC Managed Care – PPO | Admitting: Obstetrics

## 2019-03-03 ENCOUNTER — Other Ambulatory Visit: Payer: Self-pay

## 2019-03-03 VITALS — BP 117/76 | HR 72 | Wt 323.0 lb

## 2019-03-03 DIAGNOSIS — Z9889 Other specified postprocedural states: Secondary | ICD-10-CM

## 2019-03-03 DIAGNOSIS — Z6841 Body Mass Index (BMI) 40.0 and over, adult: Secondary | ICD-10-CM

## 2019-03-03 DIAGNOSIS — Z3009 Encounter for other general counseling and advice on contraception: Secondary | ICD-10-CM

## 2019-03-03 NOTE — Progress Notes (Signed)
Subjective:        Kathy Hayes is a 29 y.o. female here for a routine exam.  Current complaints: None.    Personal health questionnaire:  Is patient Ashkenazi Jewish, have a family history of breast and/or ovarian cancer: no Is there a family history of uterine cancer diagnosed at age < 74, gastrointestinal cancer, urinary tract cancer, family member who is a Personnel officer syndrome-associated carrier: no Is the patient overweight and hypertensive, family history of diabetes, personal history of gestational diabetes, preeclampsia or PCOS: no Is patient over 34, have PCOS,  family history of premature CHD under age 101, diabetes, smoke, have hypertension or peripheral artery disease:  no At any time, has a partner hit, kicked or otherwise hurt or frightened you?: no Over the past 2 weeks, have you felt down, depressed or hopeless?: no Over the past 2 weeks, have you felt little interest or pleasure in doing things?:no   Gynecologic History No LMP recorded. Contraception: none Last Pap: 02-14-2019. Results were: normal Last mammogram: n/a. Results were: normal  Obstetric History OB History  Gravida Para Term Preterm AB Living  1 1 1  0 0 1  SAB TAB Ectopic Multiple Live Births  0 0 0 0 1    # Outcome Date GA Lbr Len/2nd Weight Sex Delivery Anes PTL Lv  1 Term 08/01/11 [redacted]w[redacted]d 05:33 / 00:26  M Vag-Spont None  LIV     Birth Comments: none    Past Medical History:  Diagnosis Date  . No pertinent past medical history     Past Surgical History:  Procedure Laterality Date  . WRIST SURGERY       Current Outpatient Medications:  .  tinidazole (TINDAMAX) 500 MG tablet, Take 2 tablets (1,000 mg total) by mouth daily with breakfast. (Patient not taking: Reported on 03/03/2019), Disp: 10 tablet, Rfl: 2 No Known Allergies  Social History   Tobacco Use  . Smoking status: Never Smoker  . Smokeless tobacco: Never Used  Substance Use Topics  . Alcohol use: No    Family History  Problem  Relation Age of Onset  . Anesthesia problems Neg Hx       Review of Systems  Constitutional: negative for fatigue and weight loss Respiratory: negative for cough and wheezing Cardiovascular: negative for chest pain, fatigue and palpitations Gastrointestinal: negative for abdominal pain and change in bowel habits Musculoskeletal:negative for myalgias Neurological: negative for gait problems and tremors Behavioral/Psych: negative for abusive relationship, depression Endocrine: negative for temperature intolerance    Genitourinary:negative for abnormal menstrual periods, genital lesions, hot flashes, sexual problems and vaginal discharge Integument/breast: negative for breast lump, breast tenderness, nipple discharge and skin lesion(s)    Objective:       BP 117/76   Pulse 72   Wt (!) 323 lb (146.5 kg)   BMI 57.22 kg/m  General:   alert  Skin:   no rash or abnormalities  Lungs:   clear to auscultation bilaterally  Heart:   regular rate and rhythm, S1, S2 normal, no murmur, click, rub or gallop  Breasts:   normal without suspicious masses, skin or nipple changes or axillary nodes  Abdomen:  normal findings: no organomegaly, soft, non-tender and no hernia  Pelvis:  External genitalia: normal general appearance Urinary system: urethral meatus normal and bladder without fullness, nontender Vaginal: normal without tenderness, induration or masses Cervix: normal appearance Adnexa: normal bimanual exam Uterus: anteverted and non-tender, normal size   Lab Review Urine pregnancy test Labs  reviewed yes Radiologic studies reviewed no  50% of 15 min visit spent on counseling and coordination of care.   Assessment:     1. Post-operative state - doing well  2. Encounter for other general counseling or advice on contraception - declines contraception - condom use encouraged  3. Class 3 severe obesity due to excess calories without serious comorbidity with body mass index (BMI)  of 50.0 to 59.9 in adult [facility]) - program of caloric restriction, exercise and behavioral modification recommended    Plan:    Education reviewed: calcium supplements, depression evaluation, low fat, low cholesterol diet, safe sex/STD prevention, self breast exams and weight bearing exercise. Contraception: none. Follow up in: 1 year.   No orders of the defined types were placed in this encounter.  No orders of the defined types were placed in this encounter.    Shelly Bombard, MD 03/03/2019 4:32 PM

## 2021-12-05 ENCOUNTER — Emergency Department (HOSPITAL_COMMUNITY)
Admission: EM | Admit: 2021-12-05 | Discharge: 2021-12-05 | Disposition: A | Discharge status: Home / Self Care | Payer: Medicaid Other | Attending: Emergency Medicine | Admitting: Emergency Medicine

## 2021-12-05 ENCOUNTER — Encounter (HOSPITAL_COMMUNITY): Payer: Self-pay

## 2021-12-05 ENCOUNTER — Emergency Department (HOSPITAL_COMMUNITY): Payer: Medicaid Other

## 2021-12-05 DIAGNOSIS — Y92410 Unspecified street and highway as the place of occurrence of the external cause: Secondary | ICD-10-CM | POA: Diagnosis not present

## 2021-12-05 DIAGNOSIS — R1030 Lower abdominal pain, unspecified: Secondary | ICD-10-CM | POA: Diagnosis present

## 2021-12-05 DIAGNOSIS — S301XXA Contusion of abdominal wall, initial encounter: Secondary | ICD-10-CM | POA: Diagnosis not present

## 2021-12-05 LAB — I-STAT BETA HCG BLOOD, ED (MC, WL, AP ONLY): I-stat hCG, quantitative: <5 m[IU]/mL (ref <5)

## 2021-12-05 LAB — I-STAT CHEM 8, ED
BUN: 12 mg/dL (ref 6–20)
Calcium, Ion: 1.16 mmol/L (ref 1.15–1.40)
Chloride: 106 mmol/L (ref 98–111)
Creatinine, Ser: 0.8 mg/dL (ref 0.44–1.00)
Glucose, Bld: 93 mg/dL (ref 70–99)
HCT: 32 % — ABNORMAL LOW (ref 36.0–46.0)
Hemoglobin: 10.9 g/dL — ABNORMAL LOW (ref 12.0–15.0)
Potassium: 3.8 mmol/L (ref 3.5–5.1)
Sodium: 140 mmol/L (ref 135–145)
TCO2: 23 mmol/L (ref 22–32)

## 2021-12-05 MED ORDER — IOHEXOL 300 MG/ML  SOLN
100.0000 mL | Freq: Once | INTRAMUSCULAR | Status: AC | PRN
  Administered 2021-12-05: 100 mL via INTRAVENOUS

## 2021-12-05 MED ORDER — IBUPROFEN 600 MG PO TABS: 600.0000 mg | ORAL_TABLET | Freq: Four times a day (QID) | ORAL | 0 refills | Status: DC | PRN

## 2021-12-05 MED ORDER — ACETAMINOPHEN 500 MG PO TABS: 500.0000 mg | ORAL_TABLET | Freq: Four times a day (QID) | ORAL | 0 refills | Status: DC | PRN
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
## 2021-12-05 NOTE — ED Notes (Signed)
Patient transported to CT 

## 2022-04-07 ENCOUNTER — Ambulatory Visit: Payer: Medicaid Other | Admitting: Physical Therapy

## 2022-04-11 ENCOUNTER — Ambulatory Visit: Payer: Medicaid Other | Attending: Nurse Practitioner | Admitting: Physical Therapy

## 2022-04-11 DIAGNOSIS — M546 Pain in thoracic spine: Secondary | ICD-10-CM | POA: Diagnosis present

## 2022-04-11 DIAGNOSIS — M6281 Muscle weakness (generalized): Secondary | ICD-10-CM | POA: Insufficient documentation

## 2022-04-11 NOTE — Therapy (Signed)
OUTPATIENT PHYSICAL THERAPY THORACOLUMBAR EVALUATION   Patient Name: Kathy Hayes MRN: [id number] DOB:05/11/1990, 32 y.o., female Today's Date: 04/11/2022  END OF SESSION:  PT End of Session - 04/11/22 1449     Visit Number 1    Number of Visits 5   with eval   Date for PT Re-Evaluation 05/23/22    Authorization Type Med Pay    PT Start Time 1449    PT Stop Time 1510   eval   PT Time Calculation (min) 21 min    Activity Tolerance Patient tolerated treatment well    Behavior During Therapy [facility] for tasks assessed/performed             Past Medical History:  Diagnosis Date   No pertinent past medical history    Past Surgical History:  Procedure Laterality Date   WRIST SURGERY     There are no problems to display for this patient.   PCP: Marval Regal, NP  REFERRING PROVIDER: Marval Regal, NP  REFERRING DIAG: M54.6 (ICD-10-CM) - Pain in thoracic spine  Rationale for Evaluation and Treatment: Rehabilitation  THERAPY DIAG:  Muscle weakness (generalized)  Pain in thoracic spine  ONSET DATE: 03/06/2022  SUBJECTIVE:                                                                                                                                                                                           SUBJECTIVE STATEMENT: Pt reports that she recently lost a lot of weight and has started having "this weird thing" in the middle of her back at the bottom of her shoulder blades described as "tightness". Pt feels it more during work (works at [employer]) or when doing a lot of physical activity. Pt describes the sensation as "tightening" and "pulling" and that it moves from side to side (R to L usually). Pt reports this sensation is not necessarily painful but that it feels "awkward". Pt also states that one time her muscles got "stuck" and she had to go home from work. Pt reports that her work is very physical, she is on her feet and lifting things for the majority of  her shift and she works a minimum of about 50 hours/week. Pt is taking a muscle relaxer that does help, also has a prescribed pain medication but hasn't needed to use it. Pt reports she did have imaging done (unable to locate in this EMR) and it showed she has mild scoliosis.  PERTINENT HISTORY:  No pertinent medical history  PAIN:  Are you having pain? Yes: NPRS scale: not rated/10 Pain location: mid-thoracic region at base of shoulder  blades Pain description: tightness Aggravating factors: work, lifting items, carrying items, etc. Relieving factors: muscle relaxer  PRECAUTIONS: None  WEIGHT BEARING RESTRICTIONS: No  FALLS:  Has patient fallen in last 6 months? 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 [employer]  PLOF: Independent with gait and Independent with transfers  PATIENT GOALS: "to discover what the issue is and fix it through physical therapy as well as maintain the gains made in therapy"  NEXT MD VISIT: N/A  OBJECTIVE:   DIAGNOSTIC FINDINGS:  None located in EMR relevant to this POC  COGNITION: Overall cognitive status: {cognition:24006}     SENSATION: {sensation:27233} WFL; no N/T in arms or legs  MUSCLE LENGTH: Hamstrings: Right *** deg; Left *** deg Marcello Moores test: Right *** deg; Left *** deg  POSTURE: rounded shoulders and forward head  PALPATION: Palpated upper shoulder (tight UT), around shoulder blades Pain starts at bse of R shoulder blades and moves to base of L shoulder blade  LUMBAR ROM:   AROM eval  Flexion WFL  Extension Hypermobile thoracic ext; tightness between shoulder blades  Right lateral flexion WFL  Left lateral flexion WFL but onset of pain in R lateral side  Right rotation decreased  Left rotation decreased   (Blank  rows = not tested)  LOWER EXTREMITY ROM:     {AROM/PROM:27142}  Right eval Left eval  Hip flexion    Hip extension    Hip abduction    Hip adduction    Hip internal rotation    Hip external rotation    Knee flexion    Knee extension    Ankle dorsiflexion    Ankle plantarflexion    Ankle inversion    Ankle eversion     (Blank rows = not tested)  LOWER EXTREMITY MMT:    MMT Right eval Left eval  Hip flexion 5 5  Hip extension    Hip abduction    Hip adduction    Hip internal rotation    Hip external rotation    Knee flexion 5 5  Knee extension 5 5  Ankle dorsiflexion 5 5  Ankle plantarflexion    Ankle inversion    Ankle eversion     (Blank rows = not tested)  LUMBAR SPECIAL TESTS:  {lumbar special test:25242}  FUNCTIONAL TESTS:  {Functional tests:24029}  GAIT: Distance walked: *** Assistive device utilized: {Assistive devices:23999} Level of assistance: {Levels of assistance:24026} Comments: ***  TODAY'S TREATMENT:                                                                                                                              ***  Supine thoracic mobilization stretch   PATIENT EDUCATION:  Education details: *** Person educated: {Person educated:25204} Education method: {Education Method:25205} Education  comprehension: {Education Comprehension:25206}  HOME EXERCISE PROGRAM: *** Access Code: [email or web address] URL: [email or web address] Date: 04/11/2022 Prepared by: Excell Seltzer  Exercises - Supine Thoracic Mobilization Towel Roll Vertical with Arm Stretch  - 1 x daily - 7 x weekly - 3 sets - 10 reps  ASSESSMENT:  CLINICAL IMPRESSION: Patient is a *** y.o. *** who was seen today for physical therapy evaluation and treatment for ***.   Patient is a *** year old *** referred to Neuro OPPT for***.   Pt's PMH is significant for: *** The following deficits were present during the exam: ***. Based on ***, pt is an incr risk for  falls. Pt would benefit from skilled PT to address these impairments and functional limitations to maximize functional mobility independence   OBJECTIVE IMPAIRMENTS: {opptimpairments:25111}.   ACTIVITY LIMITATIONS: {activitylimitations:27494}  PARTICIPATION LIMITATIONS: {participationrestrictions:25113}  PERSONAL FACTORS: {Personal factors:25162} are also affecting patient's functional outcome.   REHAB POTENTIAL: {rehabpotential:25112}  CLINICAL DECISION MAKING: {clinical decision making:25114}  EVALUATION COMPLEXITY: {Evaluation complexity:25115}   GOALS: Goals reviewed with patient? {yes/no:20286}  SHORT TERM GOALS: Target date: ***  *** Baseline: Goal status: {GOALSTATUS:25110}  2.  *** Baseline:  Goal status: {GOALSTATUS:25110}  3.  *** Baseline:  Goal status: {GOALSTATUS:25110}  4.  *** Baseline:  Goal status: {GOALSTATUS:25110}  5.  *** Baseline:  Goal status: {GOALSTATUS:25110}  6.  *** Baseline:  Goal status: {GOALSTATUS:25110}  LONG TERM GOALS: Target date: ***  *** Baseline:  Goal status: {GOALSTATUS:25110}  2.  *** Baseline:  Goal status: {GOALSTATUS:25110}  3.  *** Baseline:  Goal status: {GOALSTATUS:25110}  4.  *** Baseline:  Goal status: {GOALSTATUS:25110}  5.  *** Baseline:  Goal status: {GOALSTATUS:25110}  6.  *** Baseline:  Goal status: {GOALSTATUS:25110}  PLAN:  PT FREQUENCY: {rehab frequency:25116}  PT DURATION: {rehab duration:25117}  PLANNED INTERVENTIONS: {rehab planned interventions:25118::"Therapeutic exercises","Therapeutic activity","Neuromuscular re-education","Balance training","Gait training","Patient/Family education","Self Care","Joint mobilization"}.  PLAN FOR NEXT SESSION: ***   Excell Seltzer, PT, DPT, CSRS 04/11/2022, 3:12 PM

## 2022-04-19 ENCOUNTER — Ambulatory Visit: Payer: Medicaid Other | Admitting: Physical Therapy

## 2022-04-26 ENCOUNTER — Ambulatory Visit: Payer: Medicaid Other | Admitting: Physical Therapy

## 2022-05-03 ENCOUNTER — Encounter: Payer: Self-pay | Admitting: Physical Therapy

## 2022-05-03 ENCOUNTER — Ambulatory Visit: Payer: Medicaid Other | Attending: Nurse Practitioner | Admitting: Physical Therapy

## 2022-05-03 DIAGNOSIS — M546 Pain in thoracic spine: Secondary | ICD-10-CM | POA: Insufficient documentation

## 2022-05-03 DIAGNOSIS — M6281 Muscle weakness (generalized): Secondary | ICD-10-CM | POA: Insufficient documentation

## 2022-05-03 NOTE — Patient Instructions (Signed)
Access Code: [place] URL: [email or web address] Date: 05/03/2022 Prepared by: Elease Etienne  Exercises - Supine Thoracic Mobilization Towel Roll Vertical with Arm Stretch  - 1 x daily - 7 x weekly - 2 sets - 10 reps - Sidelying Thoracic Rotation with Open Book  - 1 x daily - 7 x weekly - 2 sets - 10 reps - Cat Cow  - 1 x daily - 7 x weekly - 1 sets - 10-15 reps - Scapular retraction with resistance  - 1 x daily - 7 x weekly - 3 sets - 10 reps - Doorway Pec Stretch at 90 Degrees Abduction  - 1 x daily - 7 x weekly - 1 sets - 2-3 reps - 45 seconds hold

## 2022-05-03 NOTE — Therapy (Signed)
OUTPATIENT PHYSICAL THERAPY THORACOLUMBAR TREATMENT   Patient Name: Kathy Hayes MRN: [id number] DOB:07-Sep-1990, 32 y.o., female Today's Date: 05/03/2022  END OF SESSION:  PT End of Session - 05/03/22 1546     Visit Number 2    Number of Visits 5   with eval   Date for PT Re-Evaluation 05/23/22    Authorization Type Med Pay    PT Start Time 1545   PT ran over w/ pt prior   PT Stop Time 1622    PT Time Calculation (min) 37 min    Activity Tolerance Patient tolerated treatment well    Behavior During Therapy [facility] for tasks assessed/performed             Past Medical History:  Diagnosis Date   No pertinent past medical history    Past Surgical History:  Procedure Laterality Date   WRIST SURGERY     There are no problems to display for this patient.   PCP: Marval Regal, NP  REFERRING PROVIDER: Marval Regal, NP  REFERRING DIAG: M54.6 (ICD-10-CM) - Pain in thoracic spine  Rationale for Evaluation and Treatment: Rehabilitation  THERAPY DIAG:  Muscle weakness (generalized)  Pain in thoracic spine  ONSET DATE: 03/06/2022  SUBJECTIVE:                                                                                                                                                                                           SUBJECTIVE STATEMENT: Pt states she has not had an episode since evaluation.  She states she has done the exercise given at eval once a day and it seems to have helped.  PERTINENT HISTORY:  No pertinent medical history  PAIN:  Are you having pain? No  PRECAUTIONS: None  WEIGHT BEARING RESTRICTIONS: No  FALLS:  Has patient fallen in last 6 months? 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 gait  and Independent with transfers  PATIENT GOALS: "to discover what the issue is and fix it through physical therapy as well as maintain the gains made in therapy"  NEXT MD VISIT: N/A  OBJECTIVE:   DIAGNOSTIC FINDINGS:  None located in EMR relevant to this POC  COGNITION: Overall cognitive status: Within functional limits for tasks assessed     SENSATION: WFL no N/T in arms or legs  POSTURE: rounded shoulders and forward head  PALPATION: Palpated upper shoulder (tight  UT), around shoulder blades Pain/tightness starts at base of R shoulder blades and moves to base of L shoulder blade  LUMBAR ROM:   AROM eval  Flexion WFL  Extension Hypermobile thoracic ext; tightness between shoulder blades  Right lateral flexion WFL  Left lateral flexion WFL but onset of pain in R lateral side  Right rotation Decreased (50%)  Left rotation Decreased (50%)   (Blank rows = not tested)   LOWER EXTREMITY MMT:    MMT Right eval Left eval  Hip flexion 5 5  Hip extension    Hip abduction    Hip adduction    Hip internal rotation    Hip external rotation    Knee flexion 5 5  Knee extension 5 5  Ankle dorsiflexion 5 5  Ankle plantarflexion    Ankle inversion    Ankle eversion     (Blank rows = not tested)   GAIT: Distance walked: various clinic distances Assistive device utilized: None Level of assistance: Complete Independence Comments: WFL  TODAY'S TREATMENT:                                                                                                                             -Side-lying thoracic open book x10 each side, pt reports good stretch and relief, prolonged hold at end range on last rep x30 seconds -Cat-cow x15 -Child's pose 2x40 seconds -Thread the needle in modified ROM x3 each side w/ 15 second hold at each end range -Birddogs x10 each side -Prone press up x15, end range hold in Cobra pose x30 seconds -90-90 doorway pec stretch 2x45 seconds  PATIENT  EDUCATION:  Education details: Continue HEP w/ additions.  Person educated: Patient Education method: Explanation, Demonstration, and Handouts Education comprehension: verbalized understanding, returned demonstration, and needs further education  HOME EXERCISE PROGRAM: Access Code: [email or web address] URL: [email or web address] Date: 05/03/2022 Prepared by: Elease Etienne  Exercises - Supine Thoracic Mobilization Towel Roll Vertical with Arm Stretch  - 1 x daily - 7 x weekly - 2 sets - 10 reps - Sidelying Thoracic Rotation with Open Book  - 1 x daily - 7 x weekly - 2 sets - 10 reps - Cat Cow  - 1 x daily - 7 x weekly - 1 sets - 10-15 reps - Scapular retraction with resistance  - 1 x daily - 7 x weekly - 3 sets - 10 reps - Doorway Pec Stretch at 90 Degrees Abduction  - 1 x daily - 7 x weekly - 1 sets - 2-3 reps - 45 seconds hold  ASSESSMENT:  CLINICAL IMPRESSION: Patient is pleasant presenting to PT in no apparent pain reporting improvements since evaluation.  This is first treatment following evaluation and pt has been compliant to HEP reporting this helps pain management.  Focus of skilled session today on adjusting HEP to address mobility deficits present in mid-spine and periscapular strength.  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   OBJECTIVE IMPAIRMENTS: decreased knowledge of condition, decreased ROM, decreased strength, impaired perceived functional ability, impaired flexibility, postural dysfunction, and pain.   ACTIVITY LIMITATIONS: carrying, lifting, and bending  PARTICIPATION LIMITATIONS: occupation  PERSONAL FACTORS:  N/A  are also affecting patient's functional outcome.   REHAB POTENTIAL: Excellent  CLINICAL DECISION MAKING: Stable/uncomplicated  EVALUATION COMPLEXITY: Low   GOALS: Goals reviewed with  patient? Yes  SHORT TERM GOALS=LONG TERM GOALS due to length of POC   LONG TERM GOALS: Target date: 05/12/2022  Pt will be independent with final HEP for improved strength and postural stability Baseline: initiated 04/11/22 Goal status: INITIAL  2.  Pt will demonstrate ability to perform lifting of up to 50# with proper lifting mechanics in order to safely perform work duties Baseline:  Goal status: INITIAL  3.  Pt will increase her thoracic rotation by grossly 25% to demonstrate increased functional mobility. Baseline: 50% (04/12/22) Goal status: INITIAL   PLAN:  PT FREQUENCY: 1x/week  PT DURATION: 4 weeks  PLANNED INTERVENTIONS: Therapeutic exercises, Therapeutic activity, Neuromuscular re-education, Balance training, Gait training, Patient/Family education, Self Care, Joint mobilization, Joint manipulation, Dry Needling, Spinal manipulation, Spinal mobilization, Cryotherapy, Moist heat, Taping, Biofeedback, Manual therapy, and Re-evaluation.  PLAN FOR NEXT SESSION: add to HEP prn for postural stability and strengthening exercises for thoracic region.  Lifting mechanics?  Continued stretching of anterior shoulder, posterior cervical and periscapular strength-rows, cervical retractions, thoracolumbar extension over chair back or horizontal bolster; chair yoga-forward fold, sun salutations w/ rotation   Bary Richard, PT, DPT 05/03/2022, 4:57 PM

## 2022-05-10 ENCOUNTER — Ambulatory Visit: Payer: Medicaid Other | Admitting: Physical Therapy

## 2022-05-10 ENCOUNTER — Encounter: Payer: Self-pay | Admitting: Physical Therapy

## 2022-05-10 DIAGNOSIS — M6281 Muscle weakness (generalized): Secondary | ICD-10-CM | POA: Diagnosis not present

## 2022-05-10 DIAGNOSIS — M546 Pain in thoracic spine: Secondary | ICD-10-CM

## 2022-05-10 NOTE — Patient Instructions (Addendum)
Access Code: [place] URL: [email or web address] Date: 05/10/2022 Prepared by: Elease Etienne  Exercises - Supine Thoracic Mobilization Towel Roll Vertical with Arm Stretch  - 1 x daily - 7 x weekly - 2 sets - 10 reps - Sidelying Thoracic Rotation with Open Book  - 1 x daily - 7 x weekly - 2 sets - 10 reps - Cat Cow  - 1 x daily - 7 x weekly - 1 sets - 10-15 reps - Scapular retraction with resistance  - 1 x daily - 7 x weekly - 3 sets - 10 reps - Doorway Pec Stretch at 90 Degrees Abduction  - 1 x daily - 7 x weekly - 1 sets - 2-3 reps - 45 seconds hold - Seated Thoracic Lumbar Extension  - 1 x daily - 7 x weekly - 1 sets - 10 reps - Thoracic Extension Mobilization on Foam Roll  - 1 x daily - 7 x weekly - 1 sets - 10 reps  Provided Chair Yoga handout-forward fold and sun salutation w/ twist

## 2022-05-10 NOTE — Therapy (Signed)
OUTPATIENT PHYSICAL THERAPY THORACOLUMBAR TREATMENT   Patient Name: Kathy Hayes MRN: [id number] DOB:October 31, 1990, 32 y.o., female Today's Date: 05/10/2022  END OF SESSION:  PT End of Session - 05/10/22 1545     Visit Number 3    Number of Visits 5   with eval   Date for PT Re-Evaluation 05/23/22    Authorization Type Med Pay    PT Start Time 1543   PT ran over w/ pt prior   PT Stop Time 1624    PT Time Calculation (min) 41 min    Activity Tolerance Patient tolerated treatment well    Behavior During Therapy [facility] for tasks assessed/performed             Past Medical History:  Diagnosis Date   No pertinent past medical history    Past Surgical History:  Procedure Laterality Date   WRIST SURGERY     There are no problems to display for this patient.   PCP: Marval Regal, NP  REFERRING PROVIDER: Marval Regal, NP  REFERRING DIAG: M54.6 (ICD-10-CM) - Pain in thoracic spine  Rationale for Evaluation and Treatment: Rehabilitation  THERAPY DIAG:  Muscle weakness (generalized)  Pain in thoracic spine  ONSET DATE: 03/06/2022  SUBJECTIVE:                                                                                                                                                                                           SUBJECTIVE STATEMENT: Pt states she has not been having much back pain lately and she attributes this to doing the HEP.  The shoulder stretches are helping the most.  PERTINENT HISTORY:  No pertinent medical history  PAIN:  Are you having pain? No  PRECAUTIONS: None  WEIGHT BEARING RESTRICTIONS: No  FALLS:  Has patient fallen in last 6 months? 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 with gait and  Independent with transfers  PATIENT GOALS: "to discover what the issue is and fix it through physical therapy as well as maintain the gains made in therapy"  NEXT MD VISIT: N/A  OBJECTIVE:   DIAGNOSTIC FINDINGS:  None located in EMR relevant to this POC  COGNITION: Overall cognitive status: Within functional limits for tasks assessed     SENSATION: WFL no N/T in arms or legs  POSTURE: rounded shoulders and forward head  PALPATION: Palpated upper shoulder (tight UT), around shoulder  blades Pain/tightness starts at base of R shoulder blades and moves to base of L shoulder blade  LUMBAR ROM:   AROM eval  Flexion WFL  Extension Hypermobile thoracic ext; tightness between shoulder blades  Right lateral flexion WFL  Left lateral flexion WFL but onset of pain in R lateral side  Right rotation Decreased (50%)  Left rotation Decreased (50%)   (Blank rows = not tested)   LOWER EXTREMITY MMT:    MMT Right eval Left eval  Hip flexion 5 5  Hip extension    Hip abduction    Hip adduction    Hip internal rotation    Hip external rotation    Knee flexion 5 5  Knee extension 5 5  Ankle dorsiflexion 5 5  Ankle plantarflexion    Ankle inversion    Ankle eversion     (Blank rows = not tested)   GAIT: Distance walked: various clinic distances Assistive device utilized: None Level of assistance: Complete Independence Comments: WFL  TODAY'S TREATMENT:                                                                                                                             -90-90 doorway pec stretch 2x45 seconds each side -Seated cervical retractions 10x3 second hold -Thoracolumbar extension over chair back into knee level forward flexion x5 > small bolster to increase extension x10 > larger bolster x10; pt endorses most relief w/ this exercise -Forward fold seated on edge of chair x10 -Sun salutation w/ twist x10 to each side -Rows w/ green theraband x15 w/ cues for  periscapular engagement -Lifting technique-discussed lifting from tight spaces, approximation to object, throwing weighted and unweighted objects 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  PATIENT EDUCATION:  Education details: Continue HEP w/ additions.  Person educated: Patient Education method: Explanation, Demonstration, and Handouts Education comprehension: verbalized understanding, returned demonstration, and needs further education  HOME EXERCISE PROGRAM: Access Code: [email or web address] URL: [email or web address] Date: 05/03/2022 Prepared by: Elease Etienne  Exercises - Supine Thoracic Mobilization Towel Roll Vertical with Arm Stretch  - 1 x daily - 7 x weekly - 2 sets - 10 reps - Sidelying Thoracic Rotation with Open Book  - 1 x daily - 7 x weekly - 2 sets - 10 reps - Cat Cow  - 1 x daily - 7 x weekly - 1 sets - 10-15 reps - Scapular retraction with resistance  - 1 x daily - 7 x weekly - 3 sets - 10 reps - Doorway Pec Stretch at 90 Degrees Abduction  - 1 x daily - 7 x weekly - 1 sets - 2-3 reps - 45 seconds hold - Seated Thoracic Lumbar Extension  - 1 x daily - 7 x weekly - 1 sets - 10 reps - Thoracic Extension Mobilization on Foam Roll  - 1 x daily - 7 x weekly - 1 sets - 10  reps  Provided Chair Yoga handout-forward fold and sun salutation w/ twist  ASSESSMENT:  CLINICAL IMPRESSION: Emphasis of skilled session on 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  Patient demonstrates favorable response to thoracic extension focused exercises.  She is showing progressive improvement in her overall mobility.  PT will continue to address safe continuation of lifting and general core strength to support the spine during work related tasks.   OBJECTIVE IMPAIRMENTS: decreased knowledge of condition, decreased ROM, decreased strength, impaired perceived functional ability, impaired flexibility, postural  dysfunction, and pain.   ACTIVITY LIMITATIONS: carrying, lifting, and bending  PARTICIPATION LIMITATIONS: occupation  PERSONAL FACTORS:  N/A  are also affecting patient's functional outcome.   REHAB POTENTIAL: Excellent  CLINICAL DECISION MAKING: Stable/uncomplicated  EVALUATION COMPLEXITY: Low   GOALS: Goals reviewed with patient? Yes  SHORT TERM GOALS=LONG TERM GOALS due to length of POC   LONG TERM GOALS: Target date: 05/12/2022  Pt will be independent with final HEP for improved strength and postural stability Baseline: initiated 04/11/22 Goal status: INITIAL  2.  Pt will demonstrate ability to perform lifting of up to 50# with proper lifting mechanics in order to safely perform work duties Baseline:  Goal status: INITIAL  3.  Pt will increase her thoracic rotation by grossly 25% to demonstrate increased functional mobility. Baseline: 50% (04/12/22) Goal status: INITIAL   PLAN:  PT FREQUENCY: 1x/week  PT DURATION: 4 weeks  PLANNED INTERVENTIONS: Therapeutic exercises, Therapeutic activity, Neuromuscular re-education, Balance training, Gait training, Patient/Family education, Self Care, Joint mobilization, Joint manipulation, Dry Needling, Spinal manipulation, Spinal mobilization, Cryotherapy, Moist heat, Taping, Biofeedback, Manual therapy, and Re-evaluation.  PLAN FOR NEXT SESSION: ASSESS LTGs-re-cert x4 wks?  add to HEP prn for postural stability and strengthening exercises for thoracic region.  Lifting mechanics?-try throwing slam ball.  Core stability in standing.  Overhead lifting.  Farmer's carry.  Bosu surge carry.  Deadlifts.   Bary Richard, PT, DPT 05/10/2022, 5:13 PM

## 2022-05-17 ENCOUNTER — Ambulatory Visit: Payer: Medicaid Other | Admitting: Physical Therapy

## 2022-05-22 ENCOUNTER — Ambulatory Visit: Payer: Medicaid Other | Admitting: Physical Therapy

## 2022-05-22 ENCOUNTER — Encounter: Payer: Self-pay | Admitting: Physical Therapy

## 2022-05-22 DIAGNOSIS — M6281 Muscle weakness (generalized): Secondary | ICD-10-CM | POA: Diagnosis not present

## 2022-05-22 DIAGNOSIS — M546 Pain in thoracic spine: Secondary | ICD-10-CM

## 2022-05-22 NOTE — Therapy (Signed)
OUTPATIENT PHYSICAL THERAPY THORACOLUMBAR TREATMENT/DISCHARGE SUMMARY   Patient Name: Kathy Hayes MRN: [id number] DOB:1990/08/30, 32 y.o., female Today's Date: 05/22/2022  PHYSICAL THERAPY DISCHARGE SUMMARY  Visits from Start of Care: 4  Current functional level related to goals / functional outcomes: See clinical impression statement.   Remaining deficits: None notable.   Education / Equipment: Plan for discharge, obtaining new referral if new issues arise in a couple months, and progress towards goals and ongoing safety with lifting tasks.   Patient agrees to discharge. Patient goals were met. Patient is being discharged due to meeting the stated rehab goals.   END OF SESSION:  PT End of Session - 05/22/22 1408     Visit Number 4    Number of Visits 5   with eval   Date for PT Re-Evaluation 05/23/22    Authorization Type Med [employer]    PT Start Time 1406   PT ran over w/ pt prior   PT Stop Time 1442    PT Time Calculation (min) 36 min    Activity Tolerance Patient tolerated treatment well    Behavior During Therapy WFL for tasks assessed/performed             Past Medical History:  Diagnosis Date   No pertinent past medical history    Past Surgical History:  Procedure Laterality Date   WRIST SURGERY     There are no problems to display for this patient.   PCP: Marval Regal, NP  REFERRING PROVIDER: Marval Regal, NP  REFERRING DIAG: M54.6 (ICD-10-CM) - Pain in thoracic spine  Rationale for Evaluation and Treatment: Rehabilitation  THERAPY DIAG:  Muscle weakness (generalized)  Pain in thoracic spine  ONSET DATE: 03/06/2022  SUBJECTIVE:                                                                                                                                                                                           SUBJECTIVE STATEMENT: Pt states she is feeling great today as today is her off day from work.  PERTINENT HISTORY:  No  pertinent medical history  PAIN:  Are you having pain? No  PRECAUTIONS: None  WEIGHT BEARING RESTRICTIONS: No  FALLS:  Has patient fallen in last 6 months? 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 [place]: Independent with gait and Independent with transfers  PATIENT GOALS: "to discover what the issue is and fix  it through physical therapy as well as maintain the gains made in therapy"  NEXT MD VISIT: N/A  OBJECTIVE:   DIAGNOSTIC FINDINGS:  None located in EMR relevant to this POC  COGNITION: Overall cognitive status: Within functional limits for tasks assessed     SENSATION: WFL no N/T in arms or legs  POSTURE: rounded shoulders and forward head  PALPATION: Palpated upper shoulder (tight UT), around shoulder blades Pain/tightness starts at base of R shoulder blades and moves to base of L shoulder blade  LUMBAR ROM:   AROM eval  Flexion WFL  Extension Hypermobile thoracic ext; tightness between shoulder blades  Right lateral flexion WFL  Left lateral flexion WFL but onset of pain in R lateral side  Right rotation Decreased (50%)  Left rotation Decreased (50%)   (Blank rows = not tested)   LOWER EXTREMITY MMT:    MMT Right eval Left eval  Hip flexion 5 5  Hip extension    Hip abduction    Hip adduction    Hip internal rotation    Hip external rotation    Knee flexion 5 5  Knee extension 5 5  Ankle dorsiflexion 5 5  Ankle plantarflexion    Ankle inversion    Ankle eversion     (Blank rows = not tested)   GAIT: Distance walked: various clinic distances Assistive device utilized: None Level of assistance: Complete Independence Comments: WFL  TODAY'S TREATMENT:                                                                                                                              -Verbally reviewed HEP.  Pt states that her HEP remains helpful and challenging as is. -Pt progressively lifts weight from 25lb-50lb x1 rep at 5lb increment increases w/ good form floor to waist height -Pt has full AROM in thoracolumbar spine, mild stiffness remains to left w/ slight lateral flexion compensation noted -Reviewed thoracic mobilizations using half bolster progressed to full stiff bolster x5 > x12 reps -Standing 3-way 10lb slam balls to encourage thoracic flexion and rotation -Standing on airex normal BOS overhead 10lb lifts to engage core x12 > thoracic rotations w/ 10lb short lever arm x15 each side -15 lb deadlifts to 8" step x10, return demo for form  PATIENT EDUCATION:  Education details: Continue HEP w/ additions.  Person educated: Patient Education method: Explanation, Demonstration, and Handouts Education comprehension: verbalized understanding, returned demonstration, and needs further education  HOME EXERCISE PROGRAM: Access Code: [email or web address] URL: [email or web address] Date: 05/03/2022 Prepared by: Elease Etienne  Exercises - Supine Thoracic Mobilization Towel Roll Vertical with Arm Stretch  - 1 x daily - 7 x weekly - 2 sets - 10 reps - Sidelying Thoracic Rotation with Open Book  - 1 x daily - 7 x weekly - 2 sets - 10 reps - Cat Cow  - 1 x daily - 7 x weekly - 1 sets - 10-15 reps - Scapular retraction with  resistance  - 1 x daily - 7 x weekly - 3 sets - 10 reps - Doorway Pec Stretch at 90 Degrees Abduction  - 1 x daily - 7 x weekly - 1 sets - 2-3 reps - 45 seconds hold - Seated Thoracic Lumbar Extension  - 1 x daily - 7 x weekly - 1 sets - 10 reps - Thoracic Extension Mobilization on Foam Roll  - 1 x daily - 7 x weekly - 1 sets - 10 reps  Provided Chair Yoga handout-forward fold and sun salutation w/ twist  ASSESSMENT:  CLINICAL IMPRESSION: Assessed LTGs this session with patient meeting all  goals.  She has reported consistent improvement in her thoracic pain and mobility demonstrating full range of rotational motion today with only mild lateral flexion compensation to the left.  She is able to lift progressive weight up to 50 lbs with good form and reports she has been using taught strategies at work especially for rotational lifting and overhead loading tasks.  She has an advanced HEP focused on painfree mobility which she has remained compliant to.  Overall, she has made great progress and is ready for and in agreement to self-management at home.  Will discharge at this time.   OBJECTIVE IMPAIRMENTS: decreased knowledge of condition, decreased ROM, decreased strength, impaired perceived functional ability, impaired flexibility, postural dysfunction, and pain.   ACTIVITY LIMITATIONS: carrying, lifting, and bending  PARTICIPATION LIMITATIONS: occupation  PERSONAL FACTORS:  N/A  are also affecting patient's functional outcome.   REHAB POTENTIAL: Excellent  CLINICAL DECISION MAKING: Stable/uncomplicated  EVALUATION COMPLEXITY: Low   GOALS: Goals reviewed with patient? Yes  SHORT TERM GOALS=LONG TERM GOALS due to length of POC   LONG TERM GOALS: Target date: 05/12/2022  Pt will be independent with final HEP for improved strength and postural stability Baseline: initiated 04/11/22; pt compliant and independent (3/25) Goal status: MET  2.  Pt will demonstrate ability to perform lifting of up to 50# with proper lifting mechanics in order to safely perform work duties Baseline: Pt progressively lifts weight from 25lb-50lb w/ good form floor to waist height (3/25) Goal status: MET  3.  Pt will increase her thoracic rotation by grossly 25% to demonstrate increased functional mobility. Baseline: 50% (04/12/22); pt has full AROM in thoracolumbar spine, mild stiffness remains to left (3/25) Goal status: MET   PLAN:  PT FREQUENCY: 1x/week  PT DURATION: 4 weeks  PLANNED  INTERVENTIONS: Therapeutic exercises, Therapeutic activity, Neuromuscular re-education, Balance training, Gait training, Patient/Family education, Self Care, Joint mobilization, Joint manipulation, Dry Needling, Spinal manipulation, Spinal mobilization, Cryotherapy, Moist heat, Taping, Biofeedback, Manual therapy, and Re-evaluation.  PLAN FOR NEXT SESSION: N/A   Bary Richard, PT, DPT 05/22/2022, 5:52 PM

## 2023-06-06 ENCOUNTER — Other Ambulatory Visit: Payer: Self-pay

## 2023-06-06 ENCOUNTER — Encounter (HOSPITAL_COMMUNITY): Payer: Self-pay | Admitting: *Deleted

## 2023-06-06 ENCOUNTER — Ambulatory Visit (HOSPITAL_COMMUNITY)
Admission: EM | Admit: 2023-06-06 | Discharge: 2023-06-06 | Disposition: A | Discharge status: Home / Self Care | Attending: Family Medicine | Admitting: Family Medicine

## 2023-06-06 ENCOUNTER — Ambulatory Visit (INDEPENDENT_AMBULATORY_CARE_PROVIDER_SITE_OTHER)

## 2023-06-06 DIAGNOSIS — M79642 Pain in left hand: Secondary | ICD-10-CM

## 2023-06-06 DIAGNOSIS — R1030 Lower abdominal pain, unspecified: Secondary | ICD-10-CM

## 2023-06-06 DIAGNOSIS — R519 Headache, unspecified: Secondary | ICD-10-CM | POA: Diagnosis not present

## 2023-06-06 MED ORDER — DICLOFENAC SODIUM 75 MG PO TBEC: 75.0000 mg | DELAYED_RELEASE_TABLET | Freq: Two times a day (BID) | ORAL | 0 refills | Status: AC

## 2023-06-06 MED ORDER — METHOCARBAMOL 1000 MG PO TABS: 1000.0000 mg | ORAL_TABLET | Freq: Two times a day (BID) | ORAL | 0 refills | Status: AC | PRN
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
## 2023-06-08 ENCOUNTER — Telehealth (HOSPITAL_COMMUNITY): Payer: Self-pay

## 2023-06-08 NOTE — Telephone Encounter (Signed)
 [facility] called regarding dosage of Methocarbamol. 1000 mg is not available. Per Dr. Marlinda Mike, dosage can be switched to 750 mg same directions and quantity. Pharmacy Technician verbalized understanding.

## 2024-04-14 ENCOUNTER — Ambulatory Visit: Payer: Self-pay | Admitting: Family Medicine
